# Patient Record
Sex: Female | Born: 1970 | Race: Black or African American | Hispanic: No | Marital: Single | Smoking: Never smoker
Health system: Southern US, Community
[De-identification: ages and names within clinical notes are randomized; demographics above are authoritative.]

## PROBLEM LIST (undated history)

## (undated) DIAGNOSIS — I10 Essential (primary) hypertension: Secondary | ICD-10-CM

## (undated) DIAGNOSIS — R748 Abnormal levels of other serum enzymes: Secondary | ICD-10-CM

## (undated) DIAGNOSIS — C801 Malignant (primary) neoplasm, unspecified: Secondary | ICD-10-CM

## (undated) DIAGNOSIS — D649 Anemia, unspecified: Secondary | ICD-10-CM

## (undated) DIAGNOSIS — K219 Gastro-esophageal reflux disease without esophagitis: Secondary | ICD-10-CM

## (undated) DIAGNOSIS — F319 Bipolar disorder, unspecified: Secondary | ICD-10-CM

## (undated) DIAGNOSIS — G4730 Sleep apnea, unspecified: Secondary | ICD-10-CM

## (undated) DIAGNOSIS — M1990 Unspecified osteoarthritis, unspecified site: Secondary | ICD-10-CM

## (undated) DIAGNOSIS — Z973 Presence of spectacles and contact lenses: Secondary | ICD-10-CM

## (undated) DIAGNOSIS — F419 Anxiety disorder, unspecified: Secondary | ICD-10-CM

## (undated) DIAGNOSIS — G459 Transient cerebral ischemic attack, unspecified: Secondary | ICD-10-CM

## (undated) DIAGNOSIS — R011 Cardiac murmur, unspecified: Secondary | ICD-10-CM

## (undated) HISTORY — PX: FOOT SURGERY: SHX648

## (undated) HISTORY — DX: Anemia, unspecified: D64.9

## (undated) HISTORY — DX: Cardiac murmur, unspecified: R01.1

## (undated) HISTORY — PX: WISDOM TOOTH EXTRACTION: SHX21

## (undated) HISTORY — DX: Essential (primary) hypertension: I10

## (undated) HISTORY — DX: Unspecified osteoarthritis, unspecified site: M19.90

## (undated) HISTORY — DX: Malignant (primary) neoplasm, unspecified: C80.1

---

## 2000-08-05 HISTORY — PX: LASIK: SHX215

## 2006-09-29 ENCOUNTER — Encounter: Admission: RE | Admit: 2006-09-29 | Discharge: 2006-09-29 | Payer: Self-pay | Admitting: Pediatrics

## 2010-08-05 DIAGNOSIS — C801 Malignant (primary) neoplasm, unspecified: Secondary | ICD-10-CM

## 2010-08-05 HISTORY — DX: Malignant (primary) neoplasm, unspecified: C80.1

## 2010-09-04 ENCOUNTER — Other Ambulatory Visit: Payer: Self-pay | Admitting: Internal Medicine

## 2010-09-04 DIAGNOSIS — Z1239 Encounter for other screening for malignant neoplasm of breast: Secondary | ICD-10-CM

## 2010-09-04 DIAGNOSIS — Z1231 Encounter for screening mammogram for malignant neoplasm of breast: Secondary | ICD-10-CM

## 2010-09-13 ENCOUNTER — Ambulatory Visit
Admission: RE | Admit: 2010-09-13 | Discharge: 2010-09-13 | Disposition: A | Discharge status: Home / Self Care | Payer: PRIVATE HEALTH INSURANCE | Source: Ambulatory Visit | Attending: Internal Medicine | Admitting: Internal Medicine

## 2010-09-13 DIAGNOSIS — Z1231 Encounter for screening mammogram for malignant neoplasm of breast: Secondary | ICD-10-CM

## 2010-09-19 ENCOUNTER — Other Ambulatory Visit: Payer: Self-pay | Admitting: Internal Medicine

## 2010-09-19 DIAGNOSIS — R928 Other abnormal and inconclusive findings on diagnostic imaging of breast: Secondary | ICD-10-CM

## 2010-09-24 ENCOUNTER — Other Ambulatory Visit: Payer: Self-pay | Admitting: Internal Medicine

## 2010-09-24 DIAGNOSIS — R928 Other abnormal and inconclusive findings on diagnostic imaging of breast: Secondary | ICD-10-CM

## 2010-09-25 ENCOUNTER — Other Ambulatory Visit: Payer: Self-pay | Admitting: Internal Medicine

## 2010-09-25 ENCOUNTER — Ambulatory Visit
Admission: RE | Admit: 2010-09-25 | Discharge: 2010-09-25 | Disposition: A | Discharge status: Home / Self Care | Payer: PRIVATE HEALTH INSURANCE | Source: Ambulatory Visit | Attending: Internal Medicine | Admitting: Internal Medicine

## 2010-09-25 ENCOUNTER — Other Ambulatory Visit: Payer: Self-pay | Admitting: Radiology

## 2010-09-25 DIAGNOSIS — R928 Other abnormal and inconclusive findings on diagnostic imaging of breast: Secondary | ICD-10-CM

## 2010-09-26 ENCOUNTER — Other Ambulatory Visit: Payer: Self-pay | Admitting: Family Medicine

## 2010-09-26 DIAGNOSIS — C50912 Malignant neoplasm of unspecified site of left female breast: Secondary | ICD-10-CM

## 2010-09-30 ENCOUNTER — Ambulatory Visit
Admission: RE | Admit: 2010-09-30 | Discharge: 2010-09-30 | Disposition: A | Discharge status: Home / Self Care | Payer: PRIVATE HEALTH INSURANCE | Source: Ambulatory Visit | Attending: Family Medicine | Admitting: Family Medicine

## 2010-09-30 DIAGNOSIS — C50912 Malignant neoplasm of unspecified site of left female breast: Secondary | ICD-10-CM

## 2010-09-30 MED ORDER — GADOBENATE DIMEGLUMINE 529 MG/ML IV SOLN
19.0000 mL | Freq: Once | INTRAVENOUS | Status: AC | PRN
  Administered 2010-09-30: 19 mL via INTRAVENOUS

## 2010-10-01 ENCOUNTER — Encounter: Payer: PRIVATE HEALTH INSURANCE | Admitting: Genetic Counselor

## 2010-10-01 ENCOUNTER — Encounter: Payer: PRIVATE HEALTH INSURANCE | Admitting: Oncology

## 2010-10-18 ENCOUNTER — Other Ambulatory Visit (HOSPITAL_COMMUNITY): Payer: Self-pay | Admitting: Surgery

## 2010-10-18 DIAGNOSIS — C50912 Malignant neoplasm of unspecified site of left female breast: Secondary | ICD-10-CM

## 2010-11-14 ENCOUNTER — Ambulatory Visit (HOSPITAL_COMMUNITY)
Admission: RE | Admit: 2010-11-14 | Discharge: 2010-11-14 | Disposition: A | Discharge status: Home / Self Care | Payer: PRIVATE HEALTH INSURANCE | Source: Ambulatory Visit | Attending: Surgery | Admitting: Surgery

## 2010-11-14 ENCOUNTER — Other Ambulatory Visit (HOSPITAL_COMMUNITY): Payer: Self-pay | Admitting: Surgery

## 2010-11-14 ENCOUNTER — Encounter (HOSPITAL_COMMUNITY)
Admission: RE | Admit: 2010-11-14 | Discharge: 2010-11-14 | Disposition: A | Discharge status: Home / Self Care | Payer: PRIVATE HEALTH INSURANCE | Source: Ambulatory Visit | Attending: Surgery | Admitting: Surgery

## 2010-11-14 DIAGNOSIS — Z01818 Encounter for other preprocedural examination: Secondary | ICD-10-CM | POA: Insufficient documentation

## 2010-11-14 DIAGNOSIS — C50919 Malignant neoplasm of unspecified site of unspecified female breast: Secondary | ICD-10-CM

## 2010-11-14 DIAGNOSIS — Z01810 Encounter for preprocedural cardiovascular examination: Secondary | ICD-10-CM | POA: Insufficient documentation

## 2010-11-14 DIAGNOSIS — Z01811 Encounter for preprocedural respiratory examination: Secondary | ICD-10-CM | POA: Insufficient documentation

## 2010-11-14 DIAGNOSIS — I10 Essential (primary) hypertension: Secondary | ICD-10-CM | POA: Insufficient documentation

## 2010-11-14 DIAGNOSIS — E119 Type 2 diabetes mellitus without complications: Secondary | ICD-10-CM | POA: Insufficient documentation

## 2010-11-14 LAB — CBC
Hemoglobin: 12.7 g/dL (ref 12.0–15.0)
MCH: 33.4 pg (ref 26.0–34.0)
MCHC: 33.5 g/dL (ref 30.0–36.0)
MCV: 99.7 fL (ref 78.0–100.0)
WBC: 9.4 10*3/uL (ref 4.0–10.5)

## 2010-11-14 LAB — DIFFERENTIAL
Basophils Relative: 0 % (ref 0–1)
Eosinophils Absolute: 0.1 10*3/uL (ref 0.0–0.7)
Eosinophils Relative: 1 % (ref 0–5)
Lymphs Abs: 5.2 10*3/uL — ABNORMAL HIGH (ref 0.7–4.0)
Monocytes Relative: 7 % (ref 3–12)
Neutro Abs: 3.4 10*3/uL (ref 1.7–7.7)
Neutrophils Relative %: 37 % — ABNORMAL LOW (ref 43–77)

## 2010-11-14 LAB — COMPREHENSIVE METABOLIC PANEL
ALT: 63 U/L — ABNORMAL HIGH (ref 0–35)
Albumin: 3.8 g/dL (ref 3.5–5.2)
Alkaline Phosphatase: 25 U/L — ABNORMAL LOW (ref 39–117)
BUN: 11 mg/dL (ref 6–23)
Chloride: 101 mEq/L (ref 96–112)
GFR calc Af Amer: >60 mL/min (ref >60)
Potassium: 3.8 mEq/L (ref 3.5–5.1)

## 2010-11-21 ENCOUNTER — Observation Stay (HOSPITAL_COMMUNITY)
Admission: RE | Admit: 2010-11-21 | Discharge: 2010-11-22 | Disposition: A | Discharge status: Home / Self Care | Payer: PRIVATE HEALTH INSURANCE | Source: Ambulatory Visit | Attending: Surgery | Admitting: Surgery

## 2010-11-21 ENCOUNTER — Ambulatory Visit (HOSPITAL_COMMUNITY)
Admission: RE | Admit: 2010-11-21 | Discharge: 2010-11-21 | Disposition: A | Discharge status: Home / Self Care | Payer: PRIVATE HEALTH INSURANCE | Source: Ambulatory Visit | Attending: Surgery | Admitting: Surgery

## 2010-11-21 ENCOUNTER — Other Ambulatory Visit: Payer: Self-pay | Admitting: Surgery

## 2010-11-21 DIAGNOSIS — C50912 Malignant neoplasm of unspecified site of left female breast: Secondary | ICD-10-CM

## 2010-11-21 DIAGNOSIS — D0590 Unspecified type of carcinoma in situ of unspecified breast: Principal | ICD-10-CM | POA: Insufficient documentation

## 2010-11-21 DIAGNOSIS — J45909 Unspecified asthma, uncomplicated: Secondary | ICD-10-CM | POA: Insufficient documentation

## 2010-11-21 DIAGNOSIS — F319 Bipolar disorder, unspecified: Secondary | ICD-10-CM | POA: Insufficient documentation

## 2010-11-21 DIAGNOSIS — E119 Type 2 diabetes mellitus without complications: Secondary | ICD-10-CM | POA: Insufficient documentation

## 2010-11-21 DIAGNOSIS — I10 Essential (primary) hypertension: Secondary | ICD-10-CM | POA: Insufficient documentation

## 2010-11-21 HISTORY — PX: MASTECTOMY: SHX3

## 2010-11-21 LAB — GLUCOSE, CAPILLARY: Glucose-Capillary: 91 mg/dL (ref 70–99)

## 2010-11-21 MED ORDER — TECHNETIUM TC 99M SULFUR COLLOID FILTERED
1.0000 | Freq: Once | INTRAVENOUS | Status: AC | PRN
  Administered 2010-11-21: 1 via INTRADERMAL

## 2010-11-22 LAB — GLUCOSE, CAPILLARY: Glucose-Capillary: 93 mg/dL (ref 70–99)

## 2010-11-23 NOTE — Discharge Summary (Signed)
  NAMETAURI, [name]               ACCOUNT NO.:  [id number]  MEDICAL RECORD NO.:  [id number]           PATIENT TYPE:  O  LOCATION:  5148                         FACILITY:  [facility]  PHYSICIAN:  Clovis Pu. Salah Nakamura, M.D.DATE OF BIRTH:  1970-08-14  DATE OF ADMISSION:  11/21/2010 DATE OF DISCHARGE:  11/22/2010                              DISCHARGE SUMMARY   ADMITTING DIAGNOSIS:  Left breast ductal carcinoma in situ.  DISCHARGE DIAGNOSIS:  Left breast ductal carcinoma in situ.  PROCEDURE PERFORMED:  Bilateral simple mastectomy with left axillary sentinel lymph node mapping.  BRIEF HISTORY:  The patient is a 40-year female with multifocal left breast DCIS.  She is admitted for bilateral mastectomy per her request and because she has a high-risk of recurrence in her lifetime.  She agreed to proceed.  HOSPITAL COURSE:  Unremarkable.  Please see operative note for details. She is discharged home postop day #1 in satisfactory condition.  She is tolerating her diet.  She was afebrile.  Wounds were clean, dry, and intact.  Drainage was adequate from all of her JP drains and not excessive.  DISCHARGE INSTRUCTIONS:  I will see her back in a week to begin to remove her drains.  She will keep her binder and Ace wrap on for 2 days. Remove the Ace wrap and binder, shower and replace a binder afterwards. She will resume her home medications which I have reviewed and checked off on in the medical reconciliation list.  I gave her a script for Percocet 1-2 tabs q.4 p.r.n. pain as well as Ultram 50 mg p.o. every 6 hours as needed.  She will resume a regular diet.  CONDITION ON DISCHARGE:  She is improved.     Rebecca Rice A. Benaiah Behan, M.D.     TAC/MEDQ  D:  11/22/2010  T:  11/22/2010  Job:  [id number]  Electronically Signed by Harriette Bouillon M.D. on 11/23/2010 01:14:03 PM

## 2010-11-23 NOTE — Op Note (Signed)
Rebecca, Rice               ACCOUNT NO.:  [id number]  MEDICAL RECORD NO.:  [id number]           PATIENT TYPE:  O  LOCATION:  5148                         FACILITY:  [facility]  PHYSICIAN:  Clovis Pu. Kavi Almquist, M.D.DATE OF BIRTH:  [birth year]  DATE OF PROCEDURE:  11/21/2010 DATE OF DISCHARGE:                              OPERATIVE REPORT   PREOPERATIVE DIAGNOSIS:  Left breast ductal carcinoma in situ, multifocal.  POSTOPERATIVE DIAGNOSIS:  Left breast ductal carcinoma in situ, multifocal.  PROCEDURES: 1. Bilateral simple mastectomy. 2. Left axillary sentinel lymph node mapping with injection of     methylene blue dye.  SURGEON:  Maisie Fus A. Marcile Fuquay, MD  ANESTHESIA:  General endotracheal anesthesia.  ESTIMATED BLOOD LOSS:  50 mL.  SPECIMENS: 1. Bilateral breast. 2. Left axillary sentinel nodes to pathology.  DRAINS:  Two JP drains to each side.  INDICATIONS FOR PROCEDURE:  The patient is a 40 year old female withmultifocal left breast DCIS.  She was seen by Plastic Surgery and given her young age and the fact that she has multifocal left breast DCIS and need for mastectomy on each side wished to proceed with bilateral mastectomies for prophylactic reasons on the right side.  Her lifetime risk of breast cancer given her young age and the fact she already has DCIS probably about 20-25% or so.  After discussion of the above and also after seeing Dr. Wayland Rice for plastic surgery options, she wished to proceed with bilateral mastectomies and has decided to delay reconstruction.  Risk of bleeding, infection, flap necrosis, complications of her diabetes, complications of the procedure, bleeding, infection, flap necrosis, need further surgery, arm swelling, stiffness, discomfort, numbness of the incision, reactions to medications we used for the procedure were all potential complications.  She understood the all above complications and agreed to proceed.  DESCRIPTION OF  PROCEDURE:  The patient was brought to the operating room.  She underwent left breast injection of technetium sulfur colloid in the holding area and I saw her in the holding area and marked her left axilla for some lymph node harvest site.  After having brought back to the operating room, she was placed supine and general anesthesia initiated.  Upper chest was prepped and draped bilaterally in sterile fashion.  Time-out was then done.  We then started our procedure in the right side which was a prophylactic side.  Curvilinear incisions were made above and below the nipple areolar complex and superior and inferior skin flaps were created up to the clavicle and down to the inferior mammary fold respectively.  The flap was taken all the way over to the midline.  The breast was then excised off the chest wall in a medial-to-lateral fashion until we got just lateral to the pectoralis minor muscle where it was amputated to include the tail of right breast. Wound was hemostatic.  It was irrigated.  I elected to go ahead and close this with combination of 3-0 Vicryl and subsequent 4-0 Monocryl stitch in a subcuticular fashion.  We then performed the same procedure on the left side.  Curvilinear incisions were made above and below the nipple-areolar complex  and superior and inferior skin flaps were made.  The flaps were taken medially to the midline.  The breast tissues were then excised in a medial-to-lateral fashion up to the pectoralis major fascia.  Once we got to the axilla, we identified 2 sentinel nodes, one was blue and hot. Of note, we injected 4 mL of methylene blue admixed with saline into the left breast in the subareolar position.  This was done prior to incision.  Once we identified this two nodes that were blue and hot, they were removed.  There were no other significant blue nodes or any significant radioactivity in the left axilla was noted.  At this point in time, we excised the  breast in a medial-to-lateral fashion after raising superior and inferior skin flaps.  The wound was irrigated. Hemostasis was achieved.  This was closed in a similar fashion with a deep layer of 3-0 Vicryl and subsequent 3-0 Monocryl subcuticular stitch.  Dermabond was placed on both sides.  Through separate stab incisions we placed 2 JP drains on the left and on the right side and secured to the skin with 3-0 nylon.  These were placed to bulb suction. All final counts of sponge, needle, and instruments were found to be correct for this portion of the case.  The patient was awoke, extubated, and taken to recovery in satisfactory condition.     Rebecca Rice A. Rebecca Rice, M.D.     TAC/MEDQ  D:  11/21/2010  T:  11/22/2010  Job:  [id number]  cc:   Rebecca Denis, DO Della Goo, M.D.  Electronically Signed by Harriette Bouillon M.D. on 11/23/2010 01:14:00 PM

## 2011-01-31 ENCOUNTER — Encounter (INDEPENDENT_AMBULATORY_CARE_PROVIDER_SITE_OTHER): Payer: Self-pay | Admitting: Surgery

## 2011-01-31 ENCOUNTER — Ambulatory Visit (INDEPENDENT_AMBULATORY_CARE_PROVIDER_SITE_OTHER): Payer: PRIVATE HEALTH INSURANCE | Admitting: Surgery

## 2011-01-31 DIAGNOSIS — D0590 Unspecified type of carcinoma in situ of unspecified breast: Secondary | ICD-10-CM

## 2011-01-31 DIAGNOSIS — D0510 Intraductal carcinoma in situ of unspecified breast: Secondary | ICD-10-CM

## 2011-01-31 NOTE — Progress Notes (Signed)
Subjective:     Patient ID: Rebecca Rice, female   DOB: 1970-12-19, 40 y.o.   MRN: [id number]    There were no vitals taken for this visit.    HPI: The patient returns to clinic today. Her chief complaint is tightness and stiffness along her mastectomy incisions. The pain as a sharp pain. It bothers her mostly in the morning. She also has some radiation of pain around both chest incisions.   Review of Systems negative     Objective:   Physical Exam :  Mastectomy incisions healed.  No erythema bilaterally.      Assessment:   S/P bilateral mastectomy for DCIS.  Plan:   Return in three months. Call Dr Kelly Splinter

## 2011-01-31 NOTE — Patient Instructions (Addendum)
Follow up in three months.   

## 2011-05-14 ENCOUNTER — Encounter (INDEPENDENT_AMBULATORY_CARE_PROVIDER_SITE_OTHER): Payer: Self-pay | Admitting: Surgery

## 2011-07-05 ENCOUNTER — Ambulatory Visit (INDEPENDENT_AMBULATORY_CARE_PROVIDER_SITE_OTHER): Payer: PRIVATE HEALTH INSURANCE | Admitting: Surgery

## 2011-07-05 ENCOUNTER — Encounter (INDEPENDENT_AMBULATORY_CARE_PROVIDER_SITE_OTHER): Payer: Self-pay | Admitting: Surgery

## 2011-07-05 ENCOUNTER — Encounter (INDEPENDENT_AMBULATORY_CARE_PROVIDER_SITE_OTHER): Payer: PRIVATE HEALTH INSURANCE | Admitting: Surgery

## 2011-07-05 VITALS — BP 116/76 | HR 70 | Temp 96.6°F | Resp 14 | Ht 60.0 in | Wt 206.2 lb

## 2011-07-05 DIAGNOSIS — Z853 Personal history of malignant neoplasm of breast: Secondary | ICD-10-CM

## 2011-07-05 NOTE — Patient Instructions (Signed)
Follow up 1 year 

## 2011-07-05 NOTE — Progress Notes (Signed)
Patient ID: Rebecca Rice, female   DOB: 19-Sep-1970, 40 y.o.   MRN: [id number]  Chief Complaint  Patient presents with  . Breast Cancer Long Term Follow Up    6 month follow up    HPI Rebecca Rice is a 40 y.o. female.  HPI The patient returns today for follow up of DCIS. She is 7 months out from bilateral mastectomy.   She complains of joint pain.   Past Medical History  Diagnosis Date  . Anemia   . Asthma   . Arthritis   . Diabetes mellitus   . COPD (chronic obstructive pulmonary disease)   . Heart murmur   . Hypertension   . Cancer     breast  . Nasal congestion   . Sore throat   . Change in voice   . Visual disturbance   . Cough   . Wheezing   . Constipation   . Nausea   . Generalized headaches   . Weakness     Past Surgical History  Procedure Date  . Foot surgery 1998  . Breast surgery     History reviewed. No pertinent family history.  Social History History  Substance Use Topics  . Smoking status: Never Smoker   . Smokeless tobacco: Never Used  . Alcohol Use: Yes    Allergies  Allergen Reactions  . Decongestant Formula   . Vicodin (Hydrocodone-Acetaminophen) Itching    Current Outpatient Prescriptions  Medication Sig Dispense Refill  . albuterol (PROVENTIL) (5 MG/ML) 0.5% nebulizer solution Take 2.5 mg by nebulization every 6 (six) hours as needed.        . benazepril-hydrochlorthiazide (LOTENSIN HCT) 20-25 MG per tablet Take 1 tablet by mouth 2 (two) times daily.        . cloNIDine-chlorthalidone (CLORPRES) 0.1-15 MG per tablet Take 10 tablets by mouth 2 (two) times daily.        . divalproex (DEPAKOTE) 500 MG 24 hr tablet Take 500 mg by mouth daily.        [name] glipiZIDE (GLUCOTROL) 5 MG tablet Take 5 mg by mouth 2 (two) times daily before a meal.        . metFORMIN (GLUCOPHAGE) 850 MG tablet Take 850 mg by mouth 2 (two) times daily with a meal.        . metoprolol (LOPRESSOR) 50 MG tablet Take 50 mg by mouth 2 (two) times daily.        .  metoprolol (TOPROL-XL) 50 MG 24 hr tablet Take 50 mg by mouth daily.        . mometasone (ASMANEX) 220 MCG/INH inhaler Inhale 220 puffs into the lungs daily.        . montelukast (SINGULAIR) 10 MG tablet Take 10 mg by mouth at bedtime.        . potassium chloride (K-DUR,KLOR-CON) 10 MEQ tablet Take 10 mEq by mouth 2 (two) times daily. M,W,F       . sertraline (ZOLOFT) 25 MG tablet Take 25 mg by mouth daily.          Review of Systems Review of Systems  Constitutional: Negative.   HENT: Negative.   Respiratory: Negative.   Cardiovascular: Negative.   Gastrointestinal: Negative.   Musculoskeletal: Positive for arthralgias.  Skin: Negative.     Blood pressure 116/76, pulse 70, temperature 96.6 F (35.9 C), temperature source Temporal, resp. rate 14, height 5' (1.524 m), weight 206 lb 3.2 oz (93.532 kg).  Physical Exam Physical Exam  Constitutional: She  appears well-developed and well-nourished.  HENT:  Head: Normocephalic and atraumatic.  Eyes: EOM are normal. Pupils are equal, round, and reactive to light.  Neck: Normal range of motion. Neck supple.  Pulmonary/Chest: Effort normal and breath sounds normal. She has no wheezes.       Both breast surgicly absent.  Some hypertrophic scarring.  No masses or axillary adenopathy.  Lymphadenopathy:    She has no cervical adenopathy.    Data Reviewed   Assessment    HistoryOFf DCIS S/P  B mastectomy April 2012.     Plan      Follow up 1 year.  Call Dr Kelly Splinter .       Keino Placencia A. 07/05/2011, 9:30 AM

## 2011-07-19 ENCOUNTER — Encounter (INDEPENDENT_AMBULATORY_CARE_PROVIDER_SITE_OTHER): Payer: Self-pay | Admitting: Surgery

## 2011-10-11 ENCOUNTER — Encounter (HOSPITAL_BASED_OUTPATIENT_CLINIC_OR_DEPARTMENT_OTHER): Payer: Self-pay | Admitting: *Deleted

## 2011-10-14 ENCOUNTER — Encounter (HOSPITAL_BASED_OUTPATIENT_CLINIC_OR_DEPARTMENT_OTHER)
Admission: RE | Admit: 2011-10-14 | Discharge: 2011-10-14 | Disposition: A | Discharge status: Home / Self Care | Payer: PRIVATE HEALTH INSURANCE | Source: Ambulatory Visit | Attending: Plastic Surgery | Admitting: Plastic Surgery

## 2011-10-14 ENCOUNTER — Encounter (HOSPITAL_BASED_OUTPATIENT_CLINIC_OR_DEPARTMENT_OTHER): Payer: Self-pay | Admitting: *Deleted

## 2011-10-14 LAB — BASIC METABOLIC PANEL
Chloride: 104 mEq/L (ref 96–112)
GFR calc Af Amer: >90 mL/min (ref >90)
GFR calc non Af Amer: >90 mL/min (ref >90)
Potassium: 3.9 mEq/L (ref 3.5–5.1)

## 2011-10-14 NOTE — Pre-Procedure Instructions (Signed)
To come for BMET 

## 2011-10-17 ENCOUNTER — Encounter (HOSPITAL_BASED_OUTPATIENT_CLINIC_OR_DEPARTMENT_OTHER): Payer: Self-pay | Admitting: Anesthesiology

## 2011-10-17 ENCOUNTER — Encounter (HOSPITAL_BASED_OUTPATIENT_CLINIC_OR_DEPARTMENT_OTHER): Payer: Self-pay | Admitting: Plastic Surgery

## 2011-10-17 ENCOUNTER — Ambulatory Visit (HOSPITAL_BASED_OUTPATIENT_CLINIC_OR_DEPARTMENT_OTHER): Payer: PRIVATE HEALTH INSURANCE | Admitting: Anesthesiology

## 2011-10-17 ENCOUNTER — Ambulatory Visit (HOSPITAL_BASED_OUTPATIENT_CLINIC_OR_DEPARTMENT_OTHER)
Admission: RE | Admit: 2011-10-17 | Discharge: 2011-10-18 | Disposition: A | Discharge status: Home / Self Care | Payer: PRIVATE HEALTH INSURANCE | Source: Ambulatory Visit | Attending: Plastic Surgery | Admitting: Plastic Surgery

## 2011-10-17 ENCOUNTER — Encounter (HOSPITAL_BASED_OUTPATIENT_CLINIC_OR_DEPARTMENT_OTHER): Payer: Self-pay | Admitting: *Deleted

## 2011-10-17 ENCOUNTER — Encounter (HOSPITAL_BASED_OUTPATIENT_CLINIC_OR_DEPARTMENT_OTHER)
Admission: RE | Disposition: A | Discharge status: Home / Self Care | Payer: Self-pay | Source: Ambulatory Visit | Attending: Plastic Surgery

## 2011-10-17 DIAGNOSIS — K219 Gastro-esophageal reflux disease without esophagitis: Secondary | ICD-10-CM | POA: Insufficient documentation

## 2011-10-17 DIAGNOSIS — Z01812 Encounter for preprocedural laboratory examination: Secondary | ICD-10-CM | POA: Insufficient documentation

## 2011-10-17 DIAGNOSIS — I10 Essential (primary) hypertension: Secondary | ICD-10-CM | POA: Insufficient documentation

## 2011-10-17 DIAGNOSIS — E119 Type 2 diabetes mellitus without complications: Secondary | ICD-10-CM | POA: Insufficient documentation

## 2011-10-17 DIAGNOSIS — Z853 Personal history of malignant neoplasm of breast: Secondary | ICD-10-CM | POA: Insufficient documentation

## 2011-10-17 DIAGNOSIS — Z9010 Acquired absence of unspecified breast and nipple: Secondary | ICD-10-CM

## 2011-10-17 DIAGNOSIS — Z421 Encounter for breast reconstruction following mastectomy: Secondary | ICD-10-CM | POA: Insufficient documentation

## 2011-10-17 HISTORY — PX: BREAST RECONSTRUCTION: SHX9

## 2011-10-17 HISTORY — DX: Transient cerebral ischemic attack, unspecified: G45.9

## 2011-10-17 HISTORY — DX: Bipolar disorder, unspecified: F31.9

## 2011-10-17 HISTORY — DX: Gastro-esophageal reflux disease without esophagitis: K21.9

## 2011-10-17 LAB — GLUCOSE, CAPILLARY: Glucose-Capillary: 125 mg/dL — ABNORMAL HIGH (ref 70–99)

## 2011-10-17 SURGERY — RECONSTRUCTION, BREAST
Anesthesia: General | Site: Breast | Laterality: Bilateral | Wound class: Clean

## 2011-10-17 MED ORDER — CEFAZOLIN SODIUM 1-5 GM-% IV SOLN
1.0000 g | INTRAVENOUS | Status: AC
  Administered 2011-10-17: 2 g via INTRAVENOUS

## 2011-10-17 MED ORDER — FENTANYL CITRATE 0.05 MG/ML IJ SOLN
INTRAMUSCULAR | Status: DC | PRN
  Administered 2011-10-17: 100 ug via INTRAVENOUS

## 2011-10-17 MED ORDER — ONDANSETRON HCL 4 MG/2ML IJ SOLN
INTRAMUSCULAR | Status: DC | PRN
  Administered 2011-10-17 (×2): 4 mg via INTRAVENOUS

## 2011-10-17 MED ORDER — TRAMADOL HCL 50 MG PO TABS
50.0000 mg | ORAL_TABLET | Freq: Four times a day (QID) | ORAL | Status: DC | PRN
  Administered 2011-10-17 – 2011-10-18 (×2): 50 mg via ORAL

## 2011-10-17 MED ORDER — ONDANSETRON HCL 4 MG/2ML IJ SOLN: 4.0000 mg | Freq: Four times a day (QID) | INTRAMUSCULAR | Status: DC | PRN

## 2011-10-17 MED ORDER — DIAZEPAM 2 MG PO TABS
2.0000 mg | ORAL_TABLET | Freq: Two times a day (BID) | ORAL | Status: DC | PRN
  Administered 2011-10-17: 2 mg via ORAL

## 2011-10-17 MED ORDER — HYDROMORPHONE HCL PF 1 MG/ML IJ SOLN
0.2500 mg | INTRAMUSCULAR | Status: DC | PRN
  Administered 2011-10-17 (×3): 0.5 mg via INTRAVENOUS

## 2011-10-17 MED ORDER — FLUTICASONE PROPIONATE HFA 44 MCG/ACT IN AERO
2.0000 | INHALATION_SPRAY | Freq: Two times a day (BID) | RESPIRATORY_TRACT | Status: DC
  Administered 2011-10-17: 2 via RESPIRATORY_TRACT

## 2011-10-17 MED ORDER — OXYCODONE HCL 5 MG PO TABS
5.0000 mg | ORAL_TABLET | ORAL | Status: DC | PRN
  Administered 2011-10-17: 5 mg via ORAL

## 2011-10-17 MED ORDER — ALBUTEROL SULFATE HFA 108 (90 BASE) MCG/ACT IN AERS: 2.0000 | INHALATION_SPRAY | Freq: Four times a day (QID) | RESPIRATORY_TRACT | Status: DC | PRN

## 2011-10-17 MED ORDER — MORPHINE SULFATE 10 MG/ML IJ SOLN
INTRAMUSCULAR | Status: DC | PRN
  Administered 2011-10-17 (×3): 2 mg via INTRAVENOUS

## 2011-10-17 MED ORDER — SERTRALINE HCL 25 MG PO TABS: 25.0000 mg | ORAL_TABLET | Freq: Every day | ORAL | Status: DC

## 2011-10-17 MED ORDER — LIDOCAINE HCL (CARDIAC) 20 MG/ML IV SOLN
INTRAVENOUS | Status: DC | PRN
  Administered 2011-10-17: 40 mg via INTRAVENOUS

## 2011-10-17 MED ORDER — PROPOFOL 10 MG/ML IV EMUL
INTRAVENOUS | Status: DC | PRN
  Administered 2011-10-17 (×2): 20 mg via INTRAVENOUS
  Administered 2011-10-17: 300 mg via INTRAVENOUS

## 2011-10-17 MED ORDER — CLONIDINE-CHLORTHALIDONE 0.1-15 MG PO TABS
10.0000 | ORAL_TABLET | Freq: Two times a day (BID) | ORAL | Status: DC
  Administered 2011-10-17: 10 via ORAL

## 2011-10-17 MED ORDER — ACETAMINOPHEN 650 MG RE SUPP: 650.0000 mg | Freq: Four times a day (QID) | RECTAL | Status: DC | PRN

## 2011-10-17 MED ORDER — SODIUM CHLORIDE 0.9 % IR SOLN
Status: DC | PRN
  Administered 2011-10-17: 09:00:00

## 2011-10-17 MED ORDER — PHENYLEPHRINE HCL 10 MG/ML IJ SOLN
10.0000 mg | INTRAVENOUS | Status: DC | PRN
  Administered 2011-10-17: 50 ug/min via INTRAVENOUS

## 2011-10-17 MED ORDER — LOSARTAN POTASSIUM-HCTZ 100-25 MG PO TABS
1.0000 | ORAL_TABLET | Freq: Every day | ORAL | Status: DC
  Administered 2011-10-17: 1 via ORAL

## 2011-10-17 MED ORDER — MONTELUKAST SODIUM 10 MG PO TABS: 10.0000 mg | ORAL_TABLET | Freq: Every day | ORAL | Status: DC

## 2011-10-17 MED ORDER — MORPHINE SULFATE 2 MG/ML IJ SOLN
1.0000 mg | INTRAMUSCULAR | Status: DC | PRN
  Administered 2011-10-18 (×2): 1 mg via INTRAVENOUS

## 2011-10-17 MED ORDER — DIVALPROEX SODIUM ER 500 MG PO TB24
2000.0000 mg | ORAL_TABLET | Freq: Every day | ORAL | Status: DC
  Administered 2011-10-17: 2000 mg via ORAL

## 2011-10-17 MED ORDER — DIPHENHYDRAMINE HCL 25 MG PO CAPS
25.0000 mg | ORAL_CAPSULE | Freq: Four times a day (QID) | ORAL | Status: DC | PRN
  Administered 2011-10-17 – 2011-10-18 (×2): 25 mg via ORAL

## 2011-10-17 MED ORDER — ONE-DAILY MULTI VITAMINS PO TABS: 1.0000 | ORAL_TABLET | Freq: Every day | ORAL | Status: DC

## 2011-10-17 MED ORDER — ACETAMINOPHEN 10 MG/ML IV SOLN
1000.0000 mg | Freq: Once | INTRAVENOUS | Status: AC
  Administered 2011-10-17: 1000 mg via INTRAVENOUS

## 2011-10-17 MED ORDER — MIDAZOLAM HCL 2 MG/2ML IJ SOLN: 0.5000 mg | INTRAMUSCULAR | Status: DC | PRN

## 2011-10-17 MED ORDER — MIDAZOLAM HCL 5 MG/5ML IJ SOLN
INTRAMUSCULAR | Status: DC | PRN
  Administered 2011-10-17: 2 mg via INTRAVENOUS

## 2011-10-17 MED ORDER — METFORMIN HCL 500 MG PO TABS
500.0000 mg | ORAL_TABLET | Freq: Two times a day (BID) | ORAL | Status: DC
  Administered 2011-10-17: 500 mg via ORAL

## 2011-10-17 MED ORDER — KCL IN DEXTROSE-NACL 10-5-0.45 MEQ/L-%-% IV SOLN: INTRAVENOUS | Status: DC

## 2011-10-17 MED ORDER — METOCLOPRAMIDE HCL 5 MG/ML IJ SOLN
INTRAMUSCULAR | Status: DC | PRN
  Administered 2011-10-17 (×2): 10 mg via INTRAVENOUS

## 2011-10-17 MED ORDER — LACTATED RINGERS IV SOLN
INTRAVENOUS | Status: DC
  Administered 2011-10-17: 07:00:00 via INTRAVENOUS

## 2011-10-17 MED ORDER — EPHEDRINE SULFATE 50 MG/ML IJ SOLN
INTRAMUSCULAR | Status: DC | PRN
  Administered 2011-10-17: 15 mg via INTRAVENOUS

## 2011-10-17 MED ORDER — POTASSIUM CHLORIDE CRYS ER 10 MEQ PO TBCR: 10.0000 meq | EXTENDED_RELEASE_TABLET | ORAL | Status: DC

## 2011-10-17 MED ORDER — ACETAMINOPHEN 325 MG PO TABS: 650.0000 mg | ORAL_TABLET | Freq: Four times a day (QID) | ORAL | Status: DC | PRN

## 2011-10-17 MED ORDER — CEFAZOLIN SODIUM 1-5 GM-% IV SOLN
1.0000 g | Freq: Three times a day (TID) | INTRAVENOUS | Status: DC
  Administered 2011-10-17 (×2): 1 g via INTRAVENOUS

## 2011-10-17 MED ORDER — GLIPIZIDE 10 MG PO TABS
10.0000 mg | ORAL_TABLET | Freq: Two times a day (BID) | ORAL | Status: DC
  Administered 2011-10-17: 10 mg via ORAL

## 2011-10-17 MED ORDER — METOCLOPRAMIDE HCL 5 MG/ML IJ SOLN: 10.0000 mg | Freq: Once | INTRAMUSCULAR | Status: AC | PRN

## 2011-10-17 SURGICAL SUPPLY — 68 items
BAG DECANTER FOR FLEXI CONT (MISCELLANEOUS) ×4 IMPLANT
BANDAGE GAUZE ELAST BULKY 4 IN (GAUZE/BANDAGES/DRESSINGS) IMPLANT
BINDER BREAST LRG (GAUZE/BANDAGES/DRESSINGS) IMPLANT
BINDER BREAST MEDIUM (GAUZE/BANDAGES/DRESSINGS) IMPLANT
BINDER BREAST XLRG (GAUZE/BANDAGES/DRESSINGS) ×2 IMPLANT
BINDER BREAST XXLRG (GAUZE/BANDAGES/DRESSINGS) IMPLANT
BIOPATCH RED 1 DISK 7.0 (GAUZE/BANDAGES/DRESSINGS) ×4 IMPLANT
BLADE HEX COATED 2.75 (ELECTRODE) ×2 IMPLANT
BLADE SURG 15 STRL LF DISP TIS (BLADE) ×3 IMPLANT
BLADE SURG 15 STRL SS (BLADE) ×3
CANISTER SUCTION 1200CC (MISCELLANEOUS) IMPLANT
CANISTER SUCTION 2500CC (MISCELLANEOUS) ×2 IMPLANT
CHLORAPREP W/TINT 26ML (MISCELLANEOUS) ×2 IMPLANT
CLOTH BEACON ORANGE TIMEOUT ST (SAFETY) ×2 IMPLANT
COVER MAYO STAND STRL (DRAPES) ×2 IMPLANT
COVER TABLE BACK 60X90 (DRAPES) ×2 IMPLANT
DECANTER SPIKE VIAL GLASS SM (MISCELLANEOUS) IMPLANT
DERMABOND ADVANCED (GAUZE/BANDAGES/DRESSINGS) ×2
DERMABOND ADVANCED .7 DNX12 (GAUZE/BANDAGES/DRESSINGS) ×2 IMPLANT
DRAIN CHANNEL 19F RND (DRAIN) ×4 IMPLANT
DRAPE LAPAROSCOPIC ABDOMINAL (DRAPES) ×2 IMPLANT
DRSG PAD ABDOMINAL 8X10 ST (GAUZE/BANDAGES/DRESSINGS) ×4 IMPLANT
DRSG TEGADERM 2-3/8X2-3/4 SM (GAUZE/BANDAGES/DRESSINGS) ×4 IMPLANT
ELECT BLADE 4.0 EZ CLEAN MEGAD (MISCELLANEOUS) ×2
ELECT BLADE 6.5 .24CM SHAFT (ELECTRODE) IMPLANT
ELECT REM PT RETURN 9FT ADLT (ELECTROSURGICAL) ×2
ELECTRODE BLDE 4.0 EZ CLN MEGD (MISCELLANEOUS) ×1 IMPLANT
ELECTRODE REM PT RTRN 9FT ADLT (ELECTROSURGICAL) ×1 IMPLANT
EVACUATOR SILICONE 100CC (DRAIN) ×4 IMPLANT
GAUZE SPONGE 4X4 12PLY STRL LF (GAUZE/BANDAGES/DRESSINGS) ×4 IMPLANT
GLOVE BIO SURGEON STRL SZ 6.5 (GLOVE) ×8 IMPLANT
GLOVE BIOGEL PI IND STRL 7.0 (GLOVE) ×1 IMPLANT
GLOVE BIOGEL PI INDICATOR 7.0 (GLOVE) ×1
GLOVE SKINSENSE NS SZ7.0 (GLOVE) ×2
GLOVE SKINSENSE STRL SZ7.0 (GLOVE) ×2 IMPLANT
GOWN PREVENTION PLUS XLARGE (GOWN DISPOSABLE) ×6 IMPLANT
GRAFT FLEX HD BILAT 4X16 THICK (Tissue Mesh) ×4 IMPLANT
IV NS 1000ML (IV SOLUTION) ×1
IV NS 1000ML BAXH (IV SOLUTION) ×1 IMPLANT
IV NS 500ML (IV SOLUTION)
IV NS 500ML BAXH (IV SOLUTION) IMPLANT
KIT FILL SYSTEM UNIVERSAL (SET/KITS/TRAYS/PACK) ×2 IMPLANT
Mentor CPX3 medium height siltex contour profile b (Breast) ×2 IMPLANT
Mentor CPX3 medium height siltex contour profile t (Breast) ×2 IMPLANT
NEEDLE 21 GA WING INFUSION (NEEDLE) IMPLANT
NEEDLE HYPO 25X1 1.5 SAFETY (NEEDLE) IMPLANT
NEEDLE SPNL 22GX3.5 QUINCKE BK (NEEDLE) IMPLANT
NS IRRIG 1000ML POUR BTL (IV SOLUTION) ×2 IMPLANT
PACK BASIN DAY SURGERY FS (CUSTOM PROCEDURE TRAY) ×2 IMPLANT
PENCIL BUTTON HOLSTER BLD 10FT (ELECTRODE) ×2 IMPLANT
PIN SAFETY STERILE (MISCELLANEOUS) IMPLANT
SLEEVE SCD COMPRESS KNEE MED (MISCELLANEOUS) ×2 IMPLANT
SPONGE LAP 18X18 X RAY DECT (DISPOSABLE) ×4 IMPLANT
STRIP CLOSURE SKIN 1/2X4 (GAUZE/BANDAGES/DRESSINGS) IMPLANT
SUT MON AB 5-0 PS2 18 (SUTURE) ×4 IMPLANT
SUT PDS AB 2-0 CT2 27 (SUTURE) ×8 IMPLANT
SUT SILK 2 0 FS (SUTURE) ×4 IMPLANT
SUT VIC AB 3-0 SH 27 (SUTURE) ×1
SUT VIC AB 3-0 SH 27X BRD (SUTURE) ×1 IMPLANT
SUT VICRYL 4-0 PS2 18IN ABS (SUTURE) ×8 IMPLANT
SYR 50ML LL SCALE MARK (SYRINGE) IMPLANT
SYR BULB IRRIGATION 50ML (SYRINGE) ×2 IMPLANT
SYR CONTROL 10ML LL (SYRINGE) IMPLANT
TOWEL OR 17X24 6PK STRL BLUE (TOWEL DISPOSABLE) ×4 IMPLANT
TUBE CONNECTING 20X1/4 (TUBING) ×4 IMPLANT
UNDERPAD 30X30 INCONTINENT (UNDERPADS AND DIAPERS) ×4 IMPLANT
WATER STERILE IRR 1000ML POUR (IV SOLUTION) IMPLANT
YANKAUER SUCT BULB TIP NO VENT (SUCTIONS) ×2 IMPLANT

## 2011-10-17 NOTE — Anesthesia Postprocedure Evaluation (Signed)
Anesthesia Post Note  Patient: Rebecca Rice  Procedure(s) Performed: Procedure(s) (LRB): BREAST RECONSTRUCTION (Bilateral)  Anesthesia type: General  Patient location: PACU  Post pain: Pain level controlled  Post assessment: Patient's Cardiovascular Status Stable  Last Vitals:  Filed Vitals:   10/17/11 1130  BP: 128/80  Pulse: 82  Temp:   Resp: 22    Post vital signs: Reviewed and stable  Level of consciousness: alert  Complications: No apparent anesthesia complications

## 2011-10-17 NOTE — Anesthesia Procedure Notes (Signed)
Procedure Name: LMA Insertion Performed by: York Grice Pre-anesthesia Checklist: Patient identified, Emergency Drugs available, Suction available, Patient being monitored and Timeout performed Patient Re-evaluated:Patient Re-evaluated prior to inductionOxygen Delivery Method: Circle system utilized Preoxygenation: Pre-oxygenation with 100% oxygen Intubation Type: IV induction Ventilation: Mask ventilation without difficulty LMA: LMA with gastric port inserted LMA Size: 4.0 Number of attempts: 1 Tube secured with: Tape Dental Injury: Teeth and Oropharynx as per pre-operative assessment

## 2011-10-17 NOTE — H&P (Signed)
History and Physical  Breanah Faddis   10/01/2011 9:15 AM Office Visit  MRN: [id number]  Department:  Plastic Surgery  Dept Phone: [phone]  Description: Female DOB: 16-Jul-1971  Provider: Wayland Denis, DO    Diagnoses  -  CA - breast cancer   - Primary   174.9     Reason for Visit  -   Breast Reconstruction     Subjective:   Patient ID: Rebecca Rice is a 41 y.o. female.  HPI Ms. Monger is a 41 yrs old female diagnoses with left breast multifocal DCIS (10/02/10). She is here for her history and physical for bilateral breast reconstruction planned for 10/17/11. She underwent bilateral mastectomies 4/12 with left SLN bx. Genetic screening was negative for BRCA 1and 2. She is bipolar and has asthma, diabetes, hypertension and BMI >35. She has not had any abdominal surgery and does not use tobacco. Her bra size was a 40D before the surgery. We discussed the risks and options for breast reconstruction. She has not had radiation and none is planned. She is a good candidate for expander placement with FlexHD reconstruction. She is a moderate risk patient with her comorbidities which was discussed. She is planning on undergoing expander/implant reconstruction. We should be able to reach a cup size B to C which she is happy about.  She is tobacco free.  The following portions of the patient's history were reviewed and updated as appropriate: allergies, current medications, past family history, past medical history, past social history, past surgical history and problem list.  Review of Systems  Constitutional: Negative.   HENT: Negative.   Eyes: Negative.   Respiratory: Negative.   Cardiovascular: Negative.   Gastrointestinal: Negative.   Genitourinary: Negative.   Musculoskeletal: Negative.   Skin: Negative.   Neurological: Negative.   Hematological: Negative.   Psychiatric/Behavioral: Negative.    Objective:   Physical Exam  Constitutional: She is oriented to person, place,  and time. She appears well-developed and well-nourished.  HENT:   Head: Normocephalic and atraumatic.  Eyes: Conjunctivae and EOM are normal. Pupils are equal, round, and reactive to light.  Neck: Normal range of motion.  Cardiovascular: Normal rate.   Pulmonary/Chest: Effort normal. No respiratory distress. She has no wheezes.  Abdominal: Soft. She exhibits no distension. There is no tenderness.  Musculoskeletal: Normal range of motion.  Neurological: She is alert and oriented to person, place, and time.  Skin: Skin is warm.  Psychiatric: She has a normal mood and affect. Her behavior is normal. Judgment and thought content normal.     Assessment:  1.  CA - breast cancer     Plan:    Assessment and Plan:   Acquired absence of the breasts secondary to bilateral mastectomies. A general discussion regarding all available methods of breast reconstruction were discussed and included: Tissue expander and implant based reconstruction, autologous only reconstructions, combination procedures, particularly latissismus dorsi flaps combined with expanders.  For each of the reconstruction methods mentioned above, the risks, benefits, alternatives, scarring, and recovery time were discussed in great detail. Specific risks detailed included bleeding, infection, hematoma, seroma, scarring, pain, wound healing complications, flap loss, fat necrosis, capsular contracture, need for implant removal, donor site complications, bulge, hernia, umbilical necrosis, need for urgent reoperation, and need for dressing changes were discussed.   The patient would like to proceed with Expander/FlexHD reconstruction. The patient was seen in holding, marked and consent signed and confirmed. There is no change in her history or physical.  Medications Ordered at the office     cephalexin (KEFLEX) 500 MG capsule 28 Take 1 capsule (500 mg total) by mouth 4 times daily.     docusate sodium (COLACE) 100 MG capsule 20 Take 1  capsule (100 mg total) by mouth 3 times daily.    HYDROcodone-acetaminophen (NORCO) 5-325 mg per tablet 30 tablet 0 10/01/2011 10/11/2011    Take 1 tablet by mouth every 6 (six) hours as needed for 10 days for Pain.    promethazine (PHENERGAN) 12.5 MG tablet 20  Take 1 tablet (12.5 mg total) by mouth every 6 (six) hours as needed for 7 days for Nausea.    diazepam (VALIUM) 2 MG tablet 20  Take 1 tablet (2 mg total) by mouth every 12 (twelve) hours as needed for 10 days for Anxiety.

## 2011-10-17 NOTE — Brief Op Note (Signed)
10/17/2011  10:03 AM  PATIENT:  Rebecca Rice  41 y.o. 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  PROCEDURE:  Procedure(s) (LRB): BREAST RECONSTRUCTION (Bilateral) WITH EXPANDER AND FLEX HD PLACEMENT  SURGEON:  Surgeon(s) and Role:    * Mehran Guderian Sanger, DO - Primary  PHYSICIAN ASSISTANT: none  ASSISTANTS: none   ANESTHESIA:   general  EBL:  Total I/O In: 2000 [I.V.:2000] Out: -   BLOOD ADMINISTERED:none  DRAINS: (2) Jackson-Pratt drain(s) with closed bulb suction in the breast pocket on each side   LOCAL MEDICATIONS USED:  NONE  SPECIMEN:  Mastectomy scars on each side  DISPOSITION OF SPECIMEN:  PATHOLOGY  COUNTS:  YES  TOURNIQUET:  * No tourniquets in log *  DICTATION: dictated  PLAN OF CARE: Admit for overnight observation  PATIENT DISPOSITION:  PACU - hemodynamically stable.   Delay start of Pharmacological VTE agent (>24hrs) due to surgical blood loss or risk of bleeding: no

## 2011-10-17 NOTE — Transfer of Care (Signed)
Immediate Anesthesia Transfer of Care Note  Patient: Rebecca Rice  Procedure(s) Performed: Procedure(s) (LRB): BREAST RECONSTRUCTION (Bilateral)  Patient Location: PACU  Anesthesia Type: General  Level of Consciousness: awake  Airway & Oxygen Therapy: Patient Spontanous Breathing and Patient connected to face mask oxygen  Post-op Assessment: Report given to PACU RN and Post -op Vital signs reviewed and stable  Post vital signs: Reviewed and stable  Complications: No apparent anesthesia complications

## 2011-10-17 NOTE — Discharge Instructions (Addendum)
Drain care daily No heavy lifting Walk at home  See JP drain care sheet to record amounts collected from each side and record separately in CC's. Take this form with you when you go back to Dr. Leonie Green office.

## 2011-10-17 NOTE — Anesthesia Preprocedure Evaluation (Signed)
Anesthesia Evaluation  Patient identified by MRN, date of birth, ID band Patient awake    Reviewed: Allergy & Precautions, H&P , NPO status , Patient's Chart, lab work & pertinent test results, reviewed documented beta blocker date and time   Airway Mallampati: II TM Distance: >3 FB Neck ROM: full    Dental   Pulmonary asthma ,          Cardiovascular hypertension, On Medications + Valvular Problems/Murmurs     Neuro/Psych PSYCHIATRIC DISORDERS TIA   GI/Hepatic negative GI ROS, Neg liver ROS, GERD-  Medicated and Controlled,  Endo/Other  Diabetes mellitus-, Type 2, Oral Hypoglycemic AgentsMorbid obesity  Renal/GU negative Renal ROS  negative genitourinary   Musculoskeletal   Abdominal   Peds  Hematology negative hematology ROS (+)   Anesthesia Other Findings See surgeon's H&P   Reproductive/Obstetrics negative OB ROS                           Anesthesia Physical Anesthesia Plan  ASA: III  Anesthesia Plan: General   Post-op Pain Management:    Induction: Intravenous  Airway Management Planned: Oral ETT  Additional Equipment:   Intra-op Plan:   Post-operative Plan: Extubation in OR  Informed Consent: I have reviewed the patients History and Physical, chart, labs and discussed the procedure including the risks, benefits and alternatives for the proposed anesthesia with the patient or authorized representative who has indicated his/her understanding and acceptance.     Plan Discussed with: CRNA and Surgeon  Anesthesia Plan Comments:         Anesthesia Quick Evaluation

## 2011-10-18 MED ORDER — TRAMADOL HCL 50 MG PO TABS: 50.0000 mg | ORAL_TABLET | Freq: Four times a day (QID) | ORAL | Status: AC | PRN

## 2011-10-18 MED ORDER — DIPHENHYDRAMINE HCL 25 MG PO CAPS: 25.0000 mg | ORAL_CAPSULE | Freq: Four times a day (QID) | ORAL | Status: AC | PRN

## 2011-10-18 MED ORDER — FEXOFENADINE HCL 60 MG PO TABS: 60.0000 mg | ORAL_TABLET | Freq: Every day | ORAL | Status: DC

## 2011-10-18 MED ORDER — RANITIDINE HCL 150 MG PO TABS: 150.0000 mg | ORAL_TABLET | Freq: Two times a day (BID) | ORAL | Status: AC

## 2011-10-18 NOTE — Op Note (Signed)
NAME:  Rebecca Rice, Rebecca Rice               ACCOUNT NO.:  [id number]  MEDICAL RECORD NO.:  [id number]  LOCATION:                                 FACILITY:  PHYSICIAN:  Jaena Brocato Sanger, DO           DATE OF BIRTH:  DATE OF PROCEDURE:  10/17/2011 DATE OF DISCHARGE:                              OPERATIVE REPORT   POSTOP DIAGNOSIS:  Acquired absence of breast and nipple areola secondary to breast cancer, bilateral.  POSTOPERATIVE DIAGNOSIS:  Acquired absence of breast and nipple areola secondary to breast cancer, bilateral.  PROCEDURE:  Bilateral breast reconstruction with expander and FlexHD placement.  ATTENDING:  Wayland Denis, DO  ANESTHESIA:  General.  INDICATION FOR PROCEDURE:  The patient is a 41 year old female who was treated for breast cancer by undergoing bilateral mastectomies in the past.  She did not get radiated. She underwent evaluation and full options for breast reconstruction were discussed. She decided on expander and FlexHD reconstruction. The risks included infection, bleeding, pain, scar, capsular contracture and risks of anesthesia. Consent was signed.  The patient was marked and taken to the operating room.  DESCRIPTION OF PROCEDURE:  Once in the operating room, the patient was placed on the operating room table in a supine position.  General anesthesia was administered.  Once adequate, a time-out was called.  All information was confirmed to be correct.  She was prepped and draped in usual sterile fashion.  A marking pen was used to mark the mastectomy scar site and a 15 blade was used to excise the scar and it was sent to pathology.  The skin hooks and Bovie were used to lift the skin flaps from the pectoralis major muscle to the edge of the borders of the breast.  Once that was complete, the pocket was irrigated with antibiotic solution.  The Bovie was used to lift the pectoralis major muscle.  Once it was lifted, the 3350 mL mentor medium height  expander was placed under the pectoralis major muscle.  The expander was prepared according to the manufacture guidelines.  The air was evacuated and it was inflated with a 150 mL of injectable normal saline.  Prior to placing the expander,  the FlexHD was placed. It was prepared according to the manufacture's guidelines.  A 4 x 16 piece of FlexHD was utilized. Fenestrations were used to help prevent seroma underneath the expander. The FlexHD was tacked to the pectoralis major muscle edge with 2-0 PDS, and to the inframammary fold  to the chest wall at the level of the inframammary fold utilizing the 2-0 PDS.  Once the expander was placed, the FlexHD was tacked to the lateral chest wall with the PDS as well.  A 15-blade was used to make a stab incision and a JP drain was placed and secured to the skin with a 3-0 silk.  The deep layers were closed with 3- 0 Vicryl followed by 4-0 Vicryl and finally a 5-0 Monocryl.  The same exact procedure was done on both sides.  A total of 150 mL of normal saline was placed in each expander.  The Dermabond was applied with ABDs and breast binder.  The patient tolerated the procedure well.  There were no complications.  She was awoken and taken to recovery room in stable condition.     Wayland Denis, DO     CS/MEDQ  D:  10/17/2011  T:  10/18/2011  Job:  [id number]

## 2011-10-23 ENCOUNTER — Encounter (HOSPITAL_BASED_OUTPATIENT_CLINIC_OR_DEPARTMENT_OTHER): Payer: Self-pay

## 2011-10-24 ENCOUNTER — Encounter (HOSPITAL_BASED_OUTPATIENT_CLINIC_OR_DEPARTMENT_OTHER): Payer: Self-pay | Admitting: Plastic Surgery

## 2011-12-12 ENCOUNTER — Emergency Department: Payer: Self-pay | Admitting: Emergency Medicine

## 2012-06-03 ENCOUNTER — Encounter (INDEPENDENT_AMBULATORY_CARE_PROVIDER_SITE_OTHER): Payer: Self-pay | Admitting: Surgery

## 2012-06-18 NOTE — Progress Notes (Signed)
To come in for bmet-ekg-to bring all meds and overnight bag Snores, never tested for sleep apnea-denies any resp problems post op bilat mastectomies and tissue expanders

## 2012-06-19 ENCOUNTER — Other Ambulatory Visit: Payer: Self-pay

## 2012-06-19 ENCOUNTER — Encounter (HOSPITAL_BASED_OUTPATIENT_CLINIC_OR_DEPARTMENT_OTHER)
Admission: RE | Admit: 2012-06-19 | Discharge: 2012-06-19 | Disposition: A | Discharge status: Home / Self Care | Payer: BC Managed Care – PPO | Source: Ambulatory Visit | Attending: Plastic Surgery | Admitting: Plastic Surgery

## 2012-06-19 LAB — BASIC METABOLIC PANEL
BUN: 12 mg/dL (ref 6–23)
CO2: 24 mEq/L (ref 19–32)
Chloride: 107 mEq/L (ref 96–112)
Creatinine, Ser: 0.75 mg/dL (ref 0.50–1.10)
Glucose, Bld: 87 mg/dL (ref 70–99)
Potassium: 3.9 mEq/L (ref 3.5–5.1)

## 2012-06-19 NOTE — Progress Notes (Signed)
ekg reviewed and cleared by dr kasik  

## 2012-06-24 ENCOUNTER — Ambulatory Visit (HOSPITAL_BASED_OUTPATIENT_CLINIC_OR_DEPARTMENT_OTHER): Payer: BC Managed Care – PPO | Admitting: Anesthesiology

## 2012-06-24 ENCOUNTER — Encounter (HOSPITAL_BASED_OUTPATIENT_CLINIC_OR_DEPARTMENT_OTHER): Payer: Self-pay | Admitting: Anesthesiology

## 2012-06-24 ENCOUNTER — Ambulatory Visit (HOSPITAL_BASED_OUTPATIENT_CLINIC_OR_DEPARTMENT_OTHER)
Admission: RE | Admit: 2012-06-24 | Discharge: 2012-06-24 | Disposition: A | Discharge status: Home / Self Care | Payer: BC Managed Care – PPO | Source: Ambulatory Visit | Attending: Plastic Surgery | Admitting: Plastic Surgery

## 2012-06-24 ENCOUNTER — Encounter (HOSPITAL_BASED_OUTPATIENT_CLINIC_OR_DEPARTMENT_OTHER)
Admission: RE | Disposition: A | Discharge status: Home / Self Care | Payer: Self-pay | Source: Ambulatory Visit | Attending: Plastic Surgery

## 2012-06-24 ENCOUNTER — Encounter (HOSPITAL_BASED_OUTPATIENT_CLINIC_OR_DEPARTMENT_OTHER): Payer: Self-pay | Admitting: Plastic Surgery

## 2012-06-24 DIAGNOSIS — Z01810 Encounter for preprocedural cardiovascular examination: Secondary | ICD-10-CM | POA: Insufficient documentation

## 2012-06-24 DIAGNOSIS — Z421 Encounter for breast reconstruction following mastectomy: Secondary | ICD-10-CM | POA: Insufficient documentation

## 2012-06-24 DIAGNOSIS — Z6835 Body mass index (BMI) 35.0-35.9, adult: Secondary | ICD-10-CM | POA: Insufficient documentation

## 2012-06-24 DIAGNOSIS — Z01812 Encounter for preprocedural laboratory examination: Secondary | ICD-10-CM | POA: Insufficient documentation

## 2012-06-24 DIAGNOSIS — E119 Type 2 diabetes mellitus without complications: Secondary | ICD-10-CM | POA: Insufficient documentation

## 2012-06-24 DIAGNOSIS — I10 Essential (primary) hypertension: Secondary | ICD-10-CM | POA: Insufficient documentation

## 2012-06-24 DIAGNOSIS — J45909 Unspecified asthma, uncomplicated: Secondary | ICD-10-CM | POA: Insufficient documentation

## 2012-06-24 DIAGNOSIS — Z79899 Other long term (current) drug therapy: Secondary | ICD-10-CM | POA: Insufficient documentation

## 2012-06-24 DIAGNOSIS — Z853 Personal history of malignant neoplasm of breast: Secondary | ICD-10-CM | POA: Insufficient documentation

## 2012-06-24 DIAGNOSIS — Z9010 Acquired absence of unspecified breast and nipple: Secondary | ICD-10-CM | POA: Insufficient documentation

## 2012-06-24 HISTORY — PX: BREAST CAPSULECTOMY WITH IMPLANT EXCHANGE: SHX5592

## 2012-06-24 LAB — GLUCOSE, CAPILLARY: Glucose-Capillary: 71 mg/dL (ref 70–99)

## 2012-06-24 SURGERY — CAPSULECTOMY, BREAST, WITH REPLACEMENT OF IMPLANT
Anesthesia: General | Site: Breast | Laterality: Bilateral | Wound class: Clean

## 2012-06-24 MED ORDER — OXYCODONE HCL 5 MG/5ML PO SOLN: 5.0000 mg | Freq: Once | ORAL | Status: DC | PRN

## 2012-06-24 MED ORDER — SODIUM CHLORIDE 0.9 % IR SOLN
Status: DC | PRN
  Administered 2012-06-24 (×2)

## 2012-06-24 MED ORDER — OXYCODONE HCL 5 MG PO TABS: 5.0000 mg | ORAL_TABLET | Freq: Once | ORAL | Status: DC | PRN

## 2012-06-24 MED ORDER — FENTANYL CITRATE 0.05 MG/ML IJ SOLN
INTRAMUSCULAR | Status: DC | PRN
  Administered 2012-06-24: 25 ug via INTRAVENOUS
  Administered 2012-06-24: 50 ug via INTRAVENOUS
  Administered 2012-06-24: 25 ug via INTRAVENOUS
  Administered 2012-06-24: 50 ug via INTRAVENOUS
  Administered 2012-06-24: 25 ug via INTRAVENOUS

## 2012-06-24 MED ORDER — DEXAMETHASONE SODIUM PHOSPHATE 4 MG/ML IJ SOLN
INTRAMUSCULAR | Status: DC | PRN
  Administered 2012-06-24: 8 mg via INTRAVENOUS

## 2012-06-24 MED ORDER — MIDAZOLAM HCL 5 MG/5ML IJ SOLN
INTRAMUSCULAR | Status: DC | PRN
  Administered 2012-06-24: 2 mg via INTRAVENOUS

## 2012-06-24 MED ORDER — CEFAZOLIN SODIUM-DEXTROSE 2-3 GM-% IV SOLR
INTRAVENOUS | Status: DC | PRN
  Administered 2012-06-24: 2 g via INTRAVENOUS

## 2012-06-24 MED ORDER — HYDROMORPHONE HCL PF 1 MG/ML IJ SOLN
0.2500 mg | INTRAMUSCULAR | Status: DC | PRN
  Administered 2012-06-24: 0.5 mg via INTRAVENOUS

## 2012-06-24 MED ORDER — ACETAMINOPHEN 10 MG/ML IV SOLN
INTRAVENOUS | Status: DC | PRN
  Administered 2012-06-24: 1000 mg via INTRAVENOUS

## 2012-06-24 MED ORDER — LIDOCAINE HCL (CARDIAC) 20 MG/ML IV SOLN
INTRAVENOUS | Status: DC | PRN
  Administered 2012-06-24: 75 mg via INTRAVENOUS

## 2012-06-24 MED ORDER — SUCCINYLCHOLINE CHLORIDE 20 MG/ML IJ SOLN
INTRAMUSCULAR | Status: DC | PRN
  Administered 2012-06-24: 100 mg via INTRAVENOUS

## 2012-06-24 MED ORDER — PROMETHAZINE HCL 25 MG/ML IJ SOLN: 6.2500 mg | INTRAMUSCULAR | Status: DC | PRN

## 2012-06-24 MED ORDER — EPHEDRINE SULFATE 50 MG/ML IJ SOLN
INTRAMUSCULAR | Status: DC | PRN
  Administered 2012-06-24: 15 mg via INTRAVENOUS

## 2012-06-24 MED ORDER — LACTATED RINGERS IV SOLN
INTRAVENOUS | Status: DC
Start: 2012-06-24 — End: 2012-06-24
  Administered 2012-06-24 (×2): via INTRAVENOUS

## 2012-06-24 MED ORDER — MIDAZOLAM HCL 2 MG/2ML IJ SOLN: 0.5000 mg | Freq: Once | INTRAMUSCULAR | Status: DC | PRN

## 2012-06-24 MED ORDER — PROPOFOL 10 MG/ML IV BOLUS
INTRAVENOUS | Status: DC | PRN
  Administered 2012-06-24: 200 mg via INTRAVENOUS

## 2012-06-24 MED ORDER — MEPERIDINE HCL 25 MG/ML IJ SOLN: 6.2500 mg | INTRAMUSCULAR | Status: DC | PRN

## 2012-06-24 SURGICAL SUPPLY — 66 items
BAG DECANTER FOR FLEXI CONT (MISCELLANEOUS) ×4 IMPLANT
BANDAGE GAUZE ELAST BULKY 4 IN (GAUZE/BANDAGES/DRESSINGS) ×4 IMPLANT
BINDER BREAST LRG (GAUZE/BANDAGES/DRESSINGS) ×2 IMPLANT
BINDER BREAST MEDIUM (GAUZE/BANDAGES/DRESSINGS) IMPLANT
BINDER BREAST XLRG (GAUZE/BANDAGES/DRESSINGS) ×2 IMPLANT
BINDER BREAST XXLRG (GAUZE/BANDAGES/DRESSINGS) IMPLANT
BIOPATCH BLUE 3/4IN DISK W/1.5 (GAUZE/BANDAGES/DRESSINGS) IMPLANT
BLADE HEX COATED 2.75 (ELECTRODE) ×2 IMPLANT
BLADE SURG 15 STRL LF DISP TIS (BLADE) ×2 IMPLANT
BLADE SURG 15 STRL SS (BLADE) ×2
CANISTER SUCTION 1200CC (MISCELLANEOUS) ×4 IMPLANT
CHLORAPREP W/TINT 26ML (MISCELLANEOUS) ×2 IMPLANT
CLOTH BEACON ORANGE TIMEOUT ST (SAFETY) ×2 IMPLANT
CORDS BIPOLAR (ELECTRODE) IMPLANT
COVER MAYO STAND STRL (DRAPES) ×2 IMPLANT
COVER TABLE BACK 60X90 (DRAPES) ×2 IMPLANT
DECANTER SPIKE VIAL GLASS SM (MISCELLANEOUS) IMPLANT
DERMABOND ADVANCED (GAUZE/BANDAGES/DRESSINGS) ×2
DERMABOND ADVANCED .7 DNX12 (GAUZE/BANDAGES/DRESSINGS) ×2 IMPLANT
DRAIN CHANNEL 19F RND (DRAIN) IMPLANT
DRAPE LAPAROSCOPIC ABDOMINAL (DRAPES) ×2 IMPLANT
DRSG PAD ABDOMINAL 8X10 ST (GAUZE/BANDAGES/DRESSINGS) ×4 IMPLANT
DRSG TEGADERM 2-3/8X2-3/4 SM (GAUZE/BANDAGES/DRESSINGS) IMPLANT
ELECT BLADE 4.0 EZ CLEAN MEGAD (MISCELLANEOUS) ×2
ELECT BLADE 6.5 .24CM SHAFT (ELECTRODE) IMPLANT
ELECT REM PT RETURN 9FT ADLT (ELECTROSURGICAL) ×2
ELECTRODE BLDE 4.0 EZ CLN MEGD (MISCELLANEOUS) ×1 IMPLANT
ELECTRODE REM PT RTRN 9FT ADLT (ELECTROSURGICAL) ×1 IMPLANT
EVACUATOR SILICONE 100CC (DRAIN) IMPLANT
GAUZE SPONGE 4X4 12PLY STRL LF (GAUZE/BANDAGES/DRESSINGS) IMPLANT
GLOVE BIO SURGEON STRL SZ 6.5 (GLOVE) ×6 IMPLANT
GLOVE SKINSENSE NS SZ6.5 (GLOVE) ×1
GLOVE SKINSENSE NS SZ7.0 (GLOVE) ×1
GLOVE SKINSENSE STRL SZ6.5 (GLOVE) ×1 IMPLANT
GLOVE SKINSENSE STRL SZ7.0 (GLOVE) ×1 IMPLANT
GOWN PREVENTION PLUS XLARGE (GOWN DISPOSABLE) ×6 IMPLANT
IMPL BREAST SILICONE 425CC (Breast) ×2 IMPLANT
IMPLANT BREAST SILICONE 425CC (Breast) ×2 IMPLANT
IMPLANT SILICONE 425CC (Breast) ×2 IMPLANT
IV NS 1000ML (IV SOLUTION)
IV NS 1000ML BAXH (IV SOLUTION) IMPLANT
IV NS 500ML (IV SOLUTION) ×1
IV NS 500ML BAXH (IV SOLUTION) ×1 IMPLANT
KIT FILL MCGHAN 30CC (MISCELLANEOUS) IMPLANT
NDL SAFETY ECLIPSE 18X1.5 (NEEDLE) ×1 IMPLANT
NEEDLE HYPO 18GX1.5 SHARP (NEEDLE) ×1
NEEDLE HYPO 25X1 1.5 SAFETY (NEEDLE) IMPLANT
PACK BASIN DAY SURGERY FS (CUSTOM PROCEDURE TRAY) ×2 IMPLANT
PENCIL BUTTON HOLSTER BLD 10FT (ELECTRODE) ×2 IMPLANT
PIN SAFETY STERILE (MISCELLANEOUS) IMPLANT
SLEEVE SCD COMPRESS KNEE MED (MISCELLANEOUS) ×2 IMPLANT
SPONGE LAP 18X18 X RAY DECT (DISPOSABLE) ×4 IMPLANT
SUT MON AB 5-0 PS2 18 (SUTURE) ×4 IMPLANT
SUT PDS 3-0 CT2 (SUTURE)
SUT PDS AB 2-0 CT2 27 (SUTURE) IMPLANT
SUT PDS II 3-0 CT2 27 ABS (SUTURE) IMPLANT
SUT VIC AB 3-0 SH 27 (SUTURE) ×2
SUT VIC AB 3-0 SH 27X BRD (SUTURE) ×2 IMPLANT
SUT VICRYL 4-0 PS2 18IN ABS (SUTURE) ×4 IMPLANT
SYR 50ML LL SCALE MARK (SYRINGE) IMPLANT
SYR BULB IRRIGATION 50ML (SYRINGE) ×2 IMPLANT
SYR CONTROL 10ML LL (SYRINGE) IMPLANT
TOWEL OR 17X24 6PK STRL BLUE (TOWEL DISPOSABLE) ×4 IMPLANT
TUBE CONNECTING 20X1/4 (TUBING) ×2 IMPLANT
UNDERPAD 30X30 INCONTINENT (UNDERPADS AND DIAPERS) ×4 IMPLANT
YANKAUER SUCT BULB TIP NO VENT (SUCTIONS) ×2 IMPLANT

## 2012-06-24 NOTE — Anesthesia Preprocedure Evaluation (Addendum)
Anesthesia Evaluation  Patient identified by MRN, date of birth, ID band Patient awake    Reviewed: Allergy & Precautions, H&P , NPO status , Patient's Chart, lab work & pertinent test results  History of Anesthesia Complications Negative for: history of anesthetic complications  Airway Mallampati: I TM Distance: >3 FB Neck ROM: Full    Dental No notable dental hx. (+) Teeth Intact and Dental Advisory Given   Pulmonary asthma (no inhaler needed in many months) ,  breath sounds clear to auscultation  Pulmonary exam normal       Cardiovascular hypertension, Pt. on medications - Valvular Problems/MurmursRhythm:Regular Rate:Normal     Neuro/Psych negative neurological ROS     GI/Hepatic Neg liver ROS, GERD-  Medicated and Controlled,  Endo/Other  diabetes, Well Controlled, Type 2, Oral Hypoglycemic AgentsMorbid obesity  Renal/GU      Musculoskeletal   Abdominal (+) + obese,   Peds  Hematology   Anesthesia Other Findings Breast cancer  Reproductive/Obstetrics LMP 2-3 weeks ago                          Anesthesia Physical Anesthesia Plan  ASA: II  Anesthesia Plan: General   Post-op Pain Management:    Induction: Intravenous  Airway Management Planned: Oral ETT  Additional Equipment:   Intra-op Plan:   Post-operative Plan: Extubation in OR  Informed Consent: I have reviewed the patients History and Physical, chart, labs and discussed the procedure including the risks, benefits and alternatives for the proposed anesthesia with the patient or authorized representative who has indicated his/her understanding and acceptance.   Dental advisory given  Plan Discussed with: CRNA and Surgeon  Anesthesia Plan Comments: (Plan routine monitors, GETA)        Anesthesia Quick Evaluation

## 2012-06-24 NOTE — Anesthesia Postprocedure Evaluation (Signed)
  Anesthesia Post-op Note  Patient: Rebecca Rice  Procedure(s) Performed: Procedure(s) (LRB) with comments: BREAST CAPSULECTOMY WITH IMPLANT EXCHANGE (Bilateral) - removal of bilateral breast expanders capsulectomies and placement of implants    Patient Location: PACU  Anesthesia Type:General  Level of Consciousness: awake, alert , oriented and patient cooperative  Airway and Oxygen Therapy: Patient Spontanous Breathing  Post-op Pain: none  Post-op Assessment: Post-op Vital signs reviewed, Patient's Cardiovascular Status Stable, Respiratory Function Stable, Patent Airway, No signs of Nausea or vomiting and Pain level controlled  Post-op Vital Signs: Reviewed and stable  Complications: No apparent anesthesia complications

## 2012-06-24 NOTE — Brief Op Note (Signed)
06/24/2012  3:34 PM  PATIENT:  Rebecca Rice  41 y.o. female  PRE-OPERATIVE DIAGNOSIS:  breast cancer  POST-OPERATIVE DIAGNOSIS:  breast cancer  PROCEDURE:  Procedure(s) (LRB) with comments: BREAST CAPSULECTOMY WITH IMPLANT EXCHANGE (Bilateral) - removal of bilateral breast expanders capsulectomies and placement of implants    SURGEON:  Surgeon(s) and Role:    * Jamita Mckelvin Sanger, DO - Primary  PHYSICIAN ASSISTANT:   ASSISTANTS: Harrie Foreman, LPN   ANESTHESIA:   general  EBL:  Total I/O In: 1000 [I.V.:1000] Out: -   BLOOD ADMINISTERED:none  DRAINS: none   LOCAL MEDICATIONS USED:  NONE  SPECIMEN:  Source of Specimen:  mastectomy scars  DISPOSITION OF SPECIMEN:  PATHOLOGY  COUNTS:  YES  TOURNIQUET:  * No tourniquets in log *  DICTATION: dictated  PLAN OF CARE: Discharge to home after PACU  PATIENT DISPOSITION:  PACU - hemodynamically stable.   Delay start of Pharmacological VTE agent (>24hrs) due to surgical blood loss or risk of bleeding: no

## 2012-06-24 NOTE — H&P (Signed)
This document contains confidential information from a [facility] medical record system and may be unauthenticated. Release may be made only with a valid authorization or in accordance with applicable policies of Medical Center or its affiliates. This document must be maintained in a secure manner or discarded/destroyed as required by Medical Center policy or by a confidential means such as shredding.   Rebecca Rice   06/09/2012 10:45 AM Office Visit  MRN: [id number]  Department: [facility]  Dept Phone: [phone]  Description: Female DOB: 01/09/1971  Provider: Wayland Denis, DO    Primary Coverage      Payor Plan Sponsor Code Group Number Group Name    [employer] [place]   [id number]     Diagnoses     Acquired absence of breast and absent nipple   - Primary    V45.71      Reason for Visit     Breast Reconstruction   Vitals - Last Recorded     166/100  1.524 m (5')  92.987 kg (205 lb)  40.04 kg/m2          Subjective:    Patient ID: Rebecca Rice is a 41 y.o. female.  HPI The patient is a 41 yrs old female diagnoses with left breast multifocal DCIS. She is here for a preoperative history and physical for second stage breast reconstruction.  She underwent first stage bilateral breast reconstruction with expander placement with FlexHD placement 10/17/11.  Her bilateral mastectomies and SLN bx were done 4/12.  Genetic screening was negative for BRCA 1and 2. She is bipolar and has asthma, diabetes, hypertension and BMI >35. She does not use tobacco. Her bra size was a 40D before the surgery. She has not had radiation.  The flaps have good color and cap refill. She currently has 460/350 cc on the left and 420/350 cc on the right.  The following portions of the patient's history were reviewed and updated as appropriate: allergies, current medications, past family history, past medical history, past social history, past surgical history and  problem list.  Review of Systems  Constitutional: Negative.   HENT: Negative.   Eyes: Negative.   Respiratory: Negative.   Cardiovascular: Negative.   Gastrointestinal: Negative.   Genitourinary: Negative.   Musculoskeletal: Negative.   Neurological: Negative.   Hematological: Negative.   Psychiatric/Behavioral: Negative.       Objective:    Physical Exam  Constitutional: She appears well-developed and well-nourished.  HENT:   Head: Normocephalic and atraumatic.  Eyes: Conjunctivae normal and EOM are normal. Pupils are equal, round, and reactive to light.  Neck: Normal range of motion.  Cardiovascular: Normal rate.   Pulmonary/Chest: Effort normal. No respiratory distress. She has no wheezes.  Abdominal: Soft.  Musculoskeletal: Normal range of motion.  Neurological: She is alert.  Skin: Skin is warm.  Psychiatric: She has a normal mood and affect. Her behavior is normal. Judgment and thought content normal.   Assessment:   1.  Acquired absence of breast and absent nipple     Plan:   We are planning on removal of the expanders and placement of the implants.  Risks and complications include bleeding, pain, scar, implant rupture or leak and capsular contracture which we discussed in detail.     Medications Ordered This Encounter      docusate sodium (COLACE) 100 MG capsule Take 1 capsule (100 mg total) by mouth 3 times daily.   cephalexin (KEFLEX) 500  MG capsule Take 1 capsule (500 mg total) by mouth 4 times daily.     diazepam (VALIUM) 2 MG tablet 20 Take 1 tablet (2 mg total) by mouth every 6 (six) hours as needed for 10 days for Anxiety.    promethazine (PHENERGAN) 12.5 MG tablet  Take 2 tablets (25 mg total) by mouth every 6 (six) hours as needed for 7 days for Nausea.

## 2012-06-24 NOTE — Transfer of Care (Signed)
Immediate Anesthesia Transfer of Care Note  Patient: Rebecca Rice  Procedure(s) Performed: Procedure(s) (LRB) with comments: BREAST CAPSULECTOMY WITH IMPLANT EXCHANGE (Bilateral) - removal of bilateral breast expanders capsulectomies and placement of implants    Patient Location: PACU  Anesthesia Type:General  Level of Consciousness: awake  Airway & Oxygen Therapy: Patient Spontanous Breathing and Patient connected to face mask oxygen  Post-op Assessment: Report given to PACU RN and Post -op Vital signs reviewed and stable  Post vital signs: Reviewed and stable  Complications: No apparent anesthesia complications

## 2012-06-24 NOTE — Interval H&P Note (Signed)
History and Physical Interval Note:  06/24/2012 1:14 PM  Rebecca Rice  has presented today for surgery, with the diagnosis of breast cancer  The various methods of treatment have been discussed with the patient and family. After consideration of risks, benefits and other options for treatment, the patient has consented to  Procedure(s) (LRB) with comments: BREAST CAPSULECTOMY WITH IMPLANT EXCHANGE (Bilateral) - removal of bilateral breast expanders capsulectomies and placement of implants   as a surgical intervention .  The patient's history has been reviewed, patient examined, no change in status, stable for surgery.  I have reviewed the patient's chart and labs.  Questions were answered to the patient's satisfaction.     SANGER,CLAIRE

## 2012-06-25 ENCOUNTER — Encounter (HOSPITAL_BASED_OUTPATIENT_CLINIC_OR_DEPARTMENT_OTHER): Payer: Self-pay | Admitting: Plastic Surgery

## 2012-06-25 LAB — GLUCOSE, CAPILLARY: Glucose-Capillary: 90 mg/dL (ref 70–99)

## 2012-06-25 NOTE — Op Note (Signed)
NAMEMarland Kitchen  TONANTZIN, MIMNAUGH NO.:  [id number]  MEDICAL RECORD NO.:  [id number]  LOCATION:[facility]            [facility]  PHYSICIAN:  Wayland Denis, DO      DATE OF BIRTH:  Mar 21, 1971  DATE OF PROCEDURE:  06/24/2012 DATE OF DISCHARGE:                              OPERATIVE REPORT   PREOPERATIVE DIAGNOSIS:  Acquired absence of bilateral breasts secondary to mastectomies for treatment of breast cancer.  POSTOPERATIVE DIAGNOSIS:  Acquired absence of bilateral breasts secondary to mastectomies for treatment of breast cancer.  PROCEDURES:  Removal of bilateral expanders and placement of implants. Implants are Mentor, smooth, round, high-profile gel, reference #[id number], lot number for the left [id number], lot number for the right [id number].  SURGEON:  Wayland Denis, DO  ASSISTANT:  Harrie Foreman, LPN  ANESTHESIA:  General.  INDICATION FOR PROCEDURE:  The patient is a 41 year old female who is diagnosed with breast cancer and underwent reconstruction with expander placement.  She presents now for exchange.  Risks and complications were reviewed and included bleeding, pain, scar, risk of anesthesia, capsular contracture, loss of implant, and infection, and asymmetry.  DESCRIPTION OF PROCEDURE:  The patient was taken to the operating room, placed on the operating room table in supine position.  General anesthesia was administered.  Once adequate, a time-out was called.  All information was confirmed to be correct.  She was prepped and draped in the usual sterile fashion.  Elliptical incision was made around the previous mastectomy scar.  We started on the right side.  The scar was sent to Pathology.  The Bovie was then used to dissect down to the expander in a graduated fashion towards the inferior aspect, so that the repair of the muscle would not be directly under the skin incision.  The expander was exposed.  The fluid was evacuated and the  expander was removed.  Capsulotomies were done superior, medial, and inferior, but not laterally.  The capsule was scored to help with symmetry and stretch.  Once that was done, the pocket was irrigated with antibiotic solution.  The 425 mL Mentor smooth, round, high-profile gel implant was selected, prepared according to the manufacturer's guidelines and placed in the pocket.  The muscle layer was closed with 3-0 Vicryl followed by 4-0 Vicryl and a running subcuticular 5-0 Monocryl was used to close the skin.  Dermabond was applied.  The same exact procedure was done on the opposite side.  After the Dermabond was applied, the ABDs and a breast binder were placed. The patient tolerated the procedure well.  There were no complications. She was allowed to wake up, was extubated, and taken to the recovery room in stable condition.     Wayland Denis, DO     CS/MEDQ  D:  06/24/2012  T:  06/25/2012  Job:  [id number]

## 2012-07-24 ENCOUNTER — Ambulatory Visit (INDEPENDENT_AMBULATORY_CARE_PROVIDER_SITE_OTHER): Payer: BC Managed Care – PPO | Admitting: Surgery

## 2012-07-24 ENCOUNTER — Encounter (INDEPENDENT_AMBULATORY_CARE_PROVIDER_SITE_OTHER): Payer: Self-pay | Admitting: Surgery

## 2012-07-24 VITALS — BP 132/80 | HR 74 | Temp 97.8°F | Resp 18 | Ht 64.0 in | Wt 199.5 lb

## 2012-07-24 DIAGNOSIS — Z853 Personal history of malignant neoplasm of breast: Secondary | ICD-10-CM

## 2012-07-24 NOTE — Patient Instructions (Addendum)
Return 6 months

## 2012-07-24 NOTE — Progress Notes (Signed)
Patient ID: Rebecca Rice, female   DOB: 20-Jun-1971, 41 y.o.   MRN: [id number]  Chief Complaint  Patient presents with  . Breast Cancer Long Term Follow Up    HPI Rebecca Rice is a 41 y.o. female.  HPI The patient returns today for follow up of DCIS. She is 18 months out from bilateral mastectomy.   She complains of joint pain.   Past Medical History  Diagnosis Date  . Cancer     breast  . Diabetes mellitus     NIDDM  . Asthma     daily and prn inhalers; triggered by environmental allergies  . Arthritis     knees, ankles, hips  . GERD (gastroesophageal reflux disease)     OTC as needed  . Bipolar disorder   . Heart murmur     states no known problems  . Hypertension     under control; has been on med. > 12 yrs.  [name] TIA (transient ischemic attack) age 86    TIA vs. Bell's palsy(was undetermined) - no residual deficits    Past Surgical History  Procedure Date  . Foot surgery 01/2010; 1998  . Mastectomy 11/21/2010    bilat. simple mastect.  Dewaine Conger 2002  . Wisdom tooth extraction age 24  . Breast reconstruction 10/17/2011    Procedure: BREAST RECONSTRUCTION;  Surgeon: Wayland Denis, DO;  Location: [facility];  Service: Plastics;  Laterality: Bilateral;  bilateral breast reconstruction with expander and flex hd placement  . Breast capsulectomy with implant exchange 06/24/2012    Procedure: BREAST CAPSULECTOMY WITH IMPLANT EXCHANGE;  Surgeon: Wayland Denis, DO;  Location:  SURGERY CENTER;  Service: Plastics;  Laterality: Bilateral;  removal of bilateral breast expanders capsulectomies and placement of implants      History reviewed. No pertinent family history.  Social History History  Substance Use Topics  . Smoking status: Never Smoker   . Smokeless tobacco: Never Used  . Alcohol Use: Yes     Comment: rarely    Allergies  Allergen Reactions  . Decongestant Formula Other (See Comments)    TACHYCARDIA  . Vicodin (Hydrocodone-Acetaminophen)  Itching  . Adhesive (Tape) Rash    Current Outpatient Prescriptions  Medication Sig Dispense Refill  . albuterol (PROVENTIL HFA;VENTOLIN HFA) 108 (90 BASE) MCG/ACT inhaler Inhale 2 puffs into the lungs every 6 (six) hours as needed.      . cetirizine (ZYRTEC) 10 MG tablet Take 10 mg by mouth daily.      . cloNIDine-chlorthalidone (CLORPRES) 0.1-15 MG per tablet Take 10 tablets by mouth 2 (two) times daily.       . divalproex (DEPAKOTE ER) 500 MG 24 hr tablet Take 2,000 mg by mouth daily. PM      . glipiZIDE (GLUCOTROL) 5 MG tablet Take 10 mg by mouth 2 (two) times daily before a meal.       . losartan-hydrochlorothiazide (HYZAAR) 100-25 MG per tablet Take 1 tablet by mouth daily. AM      . metFORMIN (GLUCOPHAGE) 500 MG tablet Take 500 mg by mouth 2 (two) times daily with a meal.      . mometasone (ASMANEX) 220 MCG/INH inhaler Inhale 220 puffs into the lungs 2 (two) times daily.       . montelukast (SINGULAIR) 10 MG tablet Take 10 mg by mouth at bedtime.       . Multiple Vitamin (MULTIVITAMIN) tablet Take 1 tablet by mouth daily.      . potassium  chloride (K-DUR,KLOR-CON) 10 MEQ tablet Take 10 mEq by mouth 3 (three) times a week. M,W,F - MORNING      . ranitidine (ZANTAC) 150 MG tablet Take 1 tablet (150 mg total) by mouth 2 (two) times daily.  20 tablet  0  . sertraline (ZOLOFT) 25 MG tablet Take 25 mg by mouth daily. AM      . [DISCONTINUED] benazepril-hydrochlorthiazide (LOTENSIN HCT) 20-25 MG per tablet Take 1 tablet by mouth 2 (two) times daily.        . [DISCONTINUED] metoprolol (LOPRESSOR) 50 MG tablet Take 50 mg by mouth 2 (two) times daily.        . [DISCONTINUED] metoprolol (TOPROL-XL) 50 MG 24 hr tablet Take 50 mg by mouth daily.          Review of Systems Review of Systems  Constitutional: Negative.   HENT: Negative.   Respiratory: Negative.   Cardiovascular: Negative.   Gastrointestinal: Negative.   Musculoskeletal: Positive for arthralgias.  Skin: Negative.     Blood  pressure 132/80, pulse 74, temperature 97.8 F (36.6 C), temperature source Temporal, resp. rate 18, height 5\' 4"  (1.626 m), weight 199 lb 8 oz (90.493 kg).  Physical Exam Physical Exam  Constitutional: She appears well-developed and well-nourished.  HENT:  Head: Normocephalic and atraumatic.  Eyes: EOM are normal. Pupils are equal, round, and reactive to light.  Neck: Normal range of motion. Neck supple.  Pulmonary/Chest: Effort normal and breath sounds normal. She has no wheezes.       Both breast surgicly absent.  Some hypertrophic scarring.  No masses or axillary adenopathy.  reconstructions in place Lymphadenopathy:    She has no cervical adenopathy.    Data Reviewed   Assessment    History Of DCIS S/P  B mastectomy April 2012.     Plan      Follow up 6 months.  Marland Kitchenask radiology about MRI follow up in this patient population       Gianne Shugars A. 07/24/2012, 12:09 PM

## 2012-11-13 ENCOUNTER — Encounter (INDEPENDENT_AMBULATORY_CARE_PROVIDER_SITE_OTHER): Payer: Self-pay | Admitting: Surgery

## 2012-12-21 ENCOUNTER — Encounter (INDEPENDENT_AMBULATORY_CARE_PROVIDER_SITE_OTHER): Payer: Self-pay

## 2013-06-25 ENCOUNTER — Other Ambulatory Visit: Payer: Self-pay | Admitting: Internal Medicine

## 2013-06-25 ENCOUNTER — Encounter (HOSPITAL_BASED_OUTPATIENT_CLINIC_OR_DEPARTMENT_OTHER): Payer: Self-pay | Admitting: *Deleted

## 2013-06-25 ENCOUNTER — Other Ambulatory Visit: Payer: Self-pay

## 2013-06-25 ENCOUNTER — Encounter (HOSPITAL_BASED_OUTPATIENT_CLINIC_OR_DEPARTMENT_OTHER)
Admission: RE | Admit: 2013-06-25 | Discharge: 2013-06-25 | Disposition: A | Discharge status: Home / Self Care | Payer: BC Managed Care – PPO | Source: Ambulatory Visit | Attending: Specialist | Admitting: Specialist

## 2013-06-25 ENCOUNTER — Ambulatory Visit
Admission: RE | Admit: 2013-06-25 | Discharge: 2013-06-25 | Disposition: A | Discharge status: Home / Self Care | Payer: BC Managed Care – PPO | Source: Ambulatory Visit | Attending: Internal Medicine | Admitting: Internal Medicine

## 2013-06-25 DIAGNOSIS — M79672 Pain in left foot: Secondary | ICD-10-CM

## 2013-06-25 DIAGNOSIS — Z01812 Encounter for preprocedural laboratory examination: Secondary | ICD-10-CM | POA: Insufficient documentation

## 2013-06-25 DIAGNOSIS — Z01818 Encounter for other preprocedural examination: Secondary | ICD-10-CM | POA: Insufficient documentation

## 2013-06-25 DIAGNOSIS — Z01810 Encounter for preprocedural cardiovascular examination: Secondary | ICD-10-CM | POA: Insufficient documentation

## 2013-06-25 LAB — BASIC METABOLIC PANEL
BUN: 11 mg/dL (ref 6–23)
Calcium: 9.8 mg/dL (ref 8.4–10.5)
Creatinine, Ser: 0.88 mg/dL (ref 0.50–1.10)
GFR calc Af Amer: >90 mL/min (ref >90)
GFR calc non Af Amer: 80 mL/min — ABNORMAL LOW (ref >90)

## 2013-06-25 NOTE — Progress Notes (Signed)
Pt was here for tissue expanders and implant put in . Did well Is having implants exchg and more scar tissue-different physician- To come in for ekg and bmet

## 2013-07-05 ENCOUNTER — Ambulatory Visit (HOSPITAL_BASED_OUTPATIENT_CLINIC_OR_DEPARTMENT_OTHER)
Admission: RE | Admit: 2013-07-05 | Discharge: 2013-07-05 | Disposition: A | Discharge status: Home / Self Care | Payer: BC Managed Care – PPO | Source: Ambulatory Visit | Attending: Specialist | Admitting: Specialist

## 2013-07-05 ENCOUNTER — Encounter (HOSPITAL_BASED_OUTPATIENT_CLINIC_OR_DEPARTMENT_OTHER): Payer: BC Managed Care – PPO | Admitting: Anesthesiology

## 2013-07-05 ENCOUNTER — Ambulatory Visit (HOSPITAL_BASED_OUTPATIENT_CLINIC_OR_DEPARTMENT_OTHER): Payer: BC Managed Care – PPO | Admitting: Anesthesiology

## 2013-07-05 ENCOUNTER — Encounter (HOSPITAL_BASED_OUTPATIENT_CLINIC_OR_DEPARTMENT_OTHER)
Admission: RE | Disposition: A | Discharge status: Home / Self Care | Payer: Self-pay | Source: Ambulatory Visit | Attending: Specialist

## 2013-07-05 ENCOUNTER — Encounter (HOSPITAL_BASED_OUTPATIENT_CLINIC_OR_DEPARTMENT_OTHER): Payer: Self-pay | Admitting: Anesthesiology

## 2013-07-05 DIAGNOSIS — J45909 Unspecified asthma, uncomplicated: Secondary | ICD-10-CM | POA: Insufficient documentation

## 2013-07-05 DIAGNOSIS — Z853 Personal history of malignant neoplasm of breast: Secondary | ICD-10-CM | POA: Insufficient documentation

## 2013-07-05 DIAGNOSIS — Z9010 Acquired absence of unspecified breast and nipple: Secondary | ICD-10-CM | POA: Insufficient documentation

## 2013-07-05 DIAGNOSIS — L905 Scar conditions and fibrosis of skin: Secondary | ICD-10-CM | POA: Insufficient documentation

## 2013-07-05 DIAGNOSIS — E119 Type 2 diabetes mellitus without complications: Secondary | ICD-10-CM | POA: Insufficient documentation

## 2013-07-05 DIAGNOSIS — Z8673 Personal history of transient ischemic attack (TIA), and cerebral infarction without residual deficits: Secondary | ICD-10-CM | POA: Insufficient documentation

## 2013-07-05 DIAGNOSIS — I10 Essential (primary) hypertension: Secondary | ICD-10-CM | POA: Insufficient documentation

## 2013-07-05 DIAGNOSIS — F319 Bipolar disorder, unspecified: Secondary | ICD-10-CM | POA: Insufficient documentation

## 2013-07-05 DIAGNOSIS — N651 Disproportion of reconstructed breast: Secondary | ICD-10-CM | POA: Insufficient documentation

## 2013-07-05 DIAGNOSIS — K219 Gastro-esophageal reflux disease without esophagitis: Secondary | ICD-10-CM | POA: Insufficient documentation

## 2013-07-05 DIAGNOSIS — M129 Arthropathy, unspecified: Secondary | ICD-10-CM | POA: Insufficient documentation

## 2013-07-05 HISTORY — PX: SCAR REVISION: SHX5285

## 2013-07-05 HISTORY — DX: Presence of spectacles and contact lenses: Z97.3

## 2013-07-05 HISTORY — PX: BREAST IMPLANT EXCHANGE: SHX6296

## 2013-07-05 LAB — GLUCOSE, CAPILLARY
Glucose-Capillary: 110 mg/dL — ABNORMAL HIGH (ref 70–99)
Glucose-Capillary: 129 mg/dL — ABNORMAL HIGH (ref 70–99)

## 2013-07-05 SURGERY — REPLACEMENT, IMPLANT, BREAST
Anesthesia: General | Site: Breast | Laterality: Bilateral | Wound class: Clean

## 2013-07-05 MED ORDER — MIDAZOLAM HCL 2 MG/2ML IJ SOLN
INTRAMUSCULAR | Status: AC
  Filled 2013-07-05: qty 2

## 2013-07-05 MED ORDER — LIDOCAINE-EPINEPHRINE 0.5 %-1:200000 IJ SOLN
INTRAMUSCULAR | Status: AC
  Filled 2013-07-05: qty 2

## 2013-07-05 MED ORDER — CEFAZOLIN SODIUM-DEXTROSE 2-3 GM-% IV SOLR
2.0000 g | INTRAVENOUS | Status: AC
  Administered 2013-07-05: 2 g via INTRAVENOUS

## 2013-07-05 MED ORDER — EPHEDRINE SULFATE 50 MG/ML IJ SOLN
INTRAMUSCULAR | Status: DC | PRN
  Administered 2013-07-05: 10 mg via INTRAVENOUS

## 2013-07-05 MED ORDER — PROPOFOL 10 MG/ML IV BOLUS
INTRAVENOUS | Status: AC
  Filled 2013-07-05: qty 20

## 2013-07-05 MED ORDER — LACTATED RINGERS IV SOLN
INTRAVENOUS | Status: DC
  Administered 2013-07-05 (×3): via INTRAVENOUS

## 2013-07-05 MED ORDER — LIDOCAINE HCL (CARDIAC) 20 MG/ML IV SOLN
INTRAVENOUS | Status: DC | PRN
  Administered 2013-07-05: 100 mg via INTRAVENOUS

## 2013-07-05 MED ORDER — GLYCOPYRROLATE 0.2 MG/ML IJ SOLN
INTRAMUSCULAR | Status: DC | PRN
  Administered 2013-07-05: 0.4 mg via INTRAVENOUS

## 2013-07-05 MED ORDER — PROMETHAZINE HCL 25 MG/ML IJ SOLN: 6.2500 mg | INTRAMUSCULAR | Status: DC | PRN

## 2013-07-05 MED ORDER — OXYCODONE HCL 5 MG PO TABS
5.0000 mg | ORAL_TABLET | Freq: Once | ORAL | Status: AC | PRN
  Administered 2013-07-05: 5 mg via ORAL

## 2013-07-05 MED ORDER — ROCURONIUM BROMIDE 100 MG/10ML IV SOLN
INTRAVENOUS | Status: DC | PRN
  Administered 2013-07-05: 50 mg via INTRAVENOUS
  Administered 2013-07-05: 10 mg via INTRAVENOUS

## 2013-07-05 MED ORDER — CEFAZOLIN SODIUM 1 G IJ SOLR
INTRAMUSCULAR | Status: DC | PRN
  Administered 2013-07-05: 2 g

## 2013-07-05 MED ORDER — PROPOFOL 10 MG/ML IV BOLUS
INTRAVENOUS | Status: DC | PRN
  Administered 2013-07-05: 170 mg via INTRAVENOUS

## 2013-07-05 MED ORDER — MIDAZOLAM HCL 5 MG/5ML IJ SOLN
INTRAMUSCULAR | Status: DC | PRN
  Administered 2013-07-05: 2 mg via INTRAVENOUS

## 2013-07-05 MED ORDER — NEOSTIGMINE METHYLSULFATE 1 MG/ML IJ SOLN
INTRAMUSCULAR | Status: DC | PRN
  Administered 2013-07-05: 3 mg via INTRAVENOUS

## 2013-07-05 MED ORDER — DEXAMETHASONE SODIUM PHOSPHATE 4 MG/ML IJ SOLN
INTRAMUSCULAR | Status: DC | PRN
  Administered 2013-07-05: 5 mg via INTRAVENOUS

## 2013-07-05 MED ORDER — HYDROMORPHONE HCL PF 1 MG/ML IJ SOLN
0.2500 mg | INTRAMUSCULAR | Status: DC | PRN
  Administered 2013-07-05: 0.5 mg via INTRAVENOUS
  Administered 2013-07-05 (×2): 0.25 mg via INTRAVENOUS

## 2013-07-05 MED ORDER — HYDROMORPHONE HCL PF 1 MG/ML IJ SOLN
INTRAMUSCULAR | Status: AC
  Filled 2013-07-05: qty 1

## 2013-07-05 MED ORDER — OXYCODONE HCL 5 MG/5ML PO SOLN: 5.0000 mg | Freq: Once | ORAL | Status: AC | PRN

## 2013-07-05 MED ORDER — LIDOCAINE-EPINEPHRINE 0.5 %-1:200000 IJ SOLN
INTRAMUSCULAR | Status: DC | PRN
  Administered 2013-07-05: 100 mL

## 2013-07-05 MED ORDER — CEFAZOLIN SODIUM-DEXTROSE 2-3 GM-% IV SOLR
INTRAVENOUS | Status: AC
  Filled 2013-07-05: qty 50

## 2013-07-05 MED ORDER — FENTANYL CITRATE 0.05 MG/ML IJ SOLN
INTRAMUSCULAR | Status: AC
  Filled 2013-07-05: qty 6

## 2013-07-05 MED ORDER — ONDANSETRON HCL 4 MG/2ML IJ SOLN
INTRAMUSCULAR | Status: DC | PRN
  Administered 2013-07-05: 4 mg via INTRAVENOUS

## 2013-07-05 MED ORDER — OXYCODONE HCL 5 MG PO TABS
ORAL_TABLET | ORAL | Status: AC
  Filled 2013-07-05: qty 1

## 2013-07-05 MED ORDER — FENTANYL CITRATE 0.05 MG/ML IJ SOLN
INTRAMUSCULAR | Status: DC | PRN
  Administered 2013-07-05 (×3): 50 ug via INTRAVENOUS

## 2013-07-05 SURGICAL SUPPLY — 82 items
BAG DECANTER FOR FLEXI CONT (MISCELLANEOUS) ×2 IMPLANT
BANDAGE ELASTIC 4 VELCRO ST LF (GAUZE/BANDAGES/DRESSINGS) IMPLANT
BANDAGE GAUZE ELAST BULKY 4 IN (GAUZE/BANDAGES/DRESSINGS) IMPLANT
BENZOIN TINCTURE PRP APPL 2/3 (GAUZE/BANDAGES/DRESSINGS) ×4 IMPLANT
BINDER BREAST LRG (GAUZE/BANDAGES/DRESSINGS) IMPLANT
BINDER BREAST MEDIUM (GAUZE/BANDAGES/DRESSINGS) IMPLANT
BINDER BREAST XLRG (GAUZE/BANDAGES/DRESSINGS) ×2 IMPLANT
BINDER BREAST XXLRG (GAUZE/BANDAGES/DRESSINGS) IMPLANT
BLADE KNIFE PERSONA 10 (BLADE) ×2 IMPLANT
BLADE KNIFE PERSONA 15 (BLADE) ×2 IMPLANT
BNDG COHESIVE 4X5 TAN STRL (GAUZE/BANDAGES/DRESSINGS) IMPLANT
CANISTER SUCT 1200ML W/VALVE (MISCELLANEOUS) ×2 IMPLANT
COVER MAYO STAND STRL (DRAPES) ×2 IMPLANT
COVER TABLE BACK 60X90 (DRAPES) ×2 IMPLANT
DECANTER SPIKE VIAL GLASS SM (MISCELLANEOUS) IMPLANT
DRAIN CHANNEL 10M FLAT 3/4 FLT (DRAIN) ×2 IMPLANT
DRAIN PENROSE 1/4X12 LTX STRL (WOUND CARE) IMPLANT
DRAPE LAPAROSCOPIC ABDOMINAL (DRAPES) ×2 IMPLANT
DRAPE LAPAROTOMY TRNSV 102X78 (DRAPE) IMPLANT
DRAPE PED LAPAROTOMY (DRAPES) IMPLANT
DRAPE U-SHAPE 76X120 STRL (DRAPES) IMPLANT
DRSG PAD ABDOMINAL 8X10 ST (GAUZE/BANDAGES/DRESSINGS) ×8 IMPLANT
ELECT BLADE 6.5 .24CM SHAFT (ELECTRODE) IMPLANT
ELECT NEEDLE TIP 2.8 STRL (NEEDLE) IMPLANT
ELECT REM PT RETURN 9FT ADLT (ELECTROSURGICAL) ×2
ELECTRODE REM PT RTRN 9FT ADLT (ELECTROSURGICAL) ×1 IMPLANT
EVACUATOR SILICONE 100CC (DRAIN) ×2 IMPLANT
FILTER 7/8 IN (FILTER) ×2 IMPLANT
GAUZE SPONGE 4X4 16PLY XRAY LF (GAUZE/BANDAGES/DRESSINGS) IMPLANT
GAUZE XEROFORM 1X8 LF (GAUZE/BANDAGES/DRESSINGS) IMPLANT
GAUZE XEROFORM 5X9 LF (GAUZE/BANDAGES/DRESSINGS) ×2 IMPLANT
GLOVE BIO SURGEON STRL SZ7 (GLOVE) ×2 IMPLANT
GLOVE BIOGEL M STRL SZ7.5 (GLOVE) ×2 IMPLANT
GLOVE BIOGEL PI IND STRL 8 (GLOVE) ×1 IMPLANT
GLOVE BIOGEL PI INDICATOR 8 (GLOVE) ×1
GLOVE ECLIPSE 7.0 STRL STRAW (GLOVE) ×2 IMPLANT
GOWN PREVENTION PLUS XLARGE (GOWN DISPOSABLE) IMPLANT
GOWN PREVENTION PLUS XXLARGE (GOWN DISPOSABLE) ×4 IMPLANT
IMPL SALINE SMOOTH RND 575 (Breast) ×2 IMPLANT
IMPLANT SALINE SMOOTH RND 575 (Breast) ×4 IMPLANT
IV NS 500ML (IV SOLUTION) ×4
IV NS 500ML BAXH (IV SOLUTION) ×4 IMPLANT
KIT FILL SYSTEM UNIVERSAL (SET/KITS/TRAYS/PACK) ×4 IMPLANT
NEEDLE HYPO 25X1 1.5 SAFETY (NEEDLE) IMPLANT
NEEDLE SPNL 18GX3.5 QUINCKE PK (NEEDLE) ×2 IMPLANT
NS IRRIG 1000ML POUR BTL (IV SOLUTION) IMPLANT
PACK BASIN DAY SURGERY FS (CUSTOM PROCEDURE TRAY) ×2 IMPLANT
PEN SKIN MARKING BROAD TIP (MISCELLANEOUS) ×2 IMPLANT
PIN SAFETY STERILE (MISCELLANEOUS) IMPLANT
SET ASEPTIC TRANSFER (MISCELLANEOUS) ×4 IMPLANT
SHEET MEDIUM DRAPE 40X70 STRL (DRAPES) IMPLANT
SHEETING SILICONE GEL EPI DERM (MISCELLANEOUS) IMPLANT
SLEEVE SCD COMPRESS KNEE MED (MISCELLANEOUS) ×2 IMPLANT
SPONGE GAUZE 4X4 12PLY (GAUZE/BANDAGES/DRESSINGS) ×2 IMPLANT
SPONGE LAP 18X18 X RAY DECT (DISPOSABLE) ×8 IMPLANT
STOCKINETTE IMPERVIOUS LG (DRAPES) IMPLANT
STRIP SUTURE WOUND CLOSURE 1/2 (SUTURE) ×8 IMPLANT
SUT FIBERWIRE 3-0 18 DIAM 3/8 (SUTURE)
SUT MNCRL AB 3-0 PS2 18 (SUTURE) ×4 IMPLANT
SUT MON AB 2-0 CT1 36 (SUTURE) ×6 IMPLANT
SUT MON AB 4-0 PC3 18 (SUTURE) IMPLANT
SUT MON AB 5-0 PS2 18 (SUTURE) IMPLANT
SUT PROLENE 3 0 PS 2 (SUTURE) IMPLANT
SUT PROLENE 4 0 PS 2 18 (SUTURE) IMPLANT
SUT PROLENE 5 0 P 3 (SUTURE) IMPLANT
SUT PROLENE 5 0 PS 2 (SUTURE) IMPLANT
SUT PROLENE 6 0 P 1 18 (SUTURE) IMPLANT
SUTURE FIBERWR 3-0 18 DIAM 3/8 (SUTURE) IMPLANT
SYR 20CC LL (SYRINGE) IMPLANT
SYR 50ML LL SCALE MARK (SYRINGE) ×4 IMPLANT
SYR BULB IRRIGATION 50ML (SYRINGE) ×2 IMPLANT
SYR CONTROL 10ML LL (SYRINGE) IMPLANT
TAPE HYPAFIX 6X30 (GAUZE/BANDAGES/DRESSINGS) ×2 IMPLANT
TAPE MEASURE 72IN RETRACT (INSTRUMENTS)
TAPE MEASURE LINEN 72IN RETRCT (INSTRUMENTS) IMPLANT
TAPE PAPER MEDFIX 1IN X 10YD (GAUZE/BANDAGES/DRESSINGS) IMPLANT
TOWEL OR 17X24 6PK STRL BLUE (TOWEL DISPOSABLE) ×8 IMPLANT
TRAY DSU PREP LF (CUSTOM PROCEDURE TRAY) ×2 IMPLANT
TUBE CONNECTING 20X1/4 (TUBING) ×2 IMPLANT
UNDERPAD 30X30 INCONTINENT (UNDERPADS AND DIAPERS) ×4 IMPLANT
VAC PENCILS W/TUBING CLEAR (MISCELLANEOUS) ×2 IMPLANT
YANKAUER SUCT BULB TIP NO VENT (SUCTIONS) ×2 IMPLANT

## 2013-07-05 NOTE — Anesthesia Postprocedure Evaluation (Signed)
  Anesthesia Post-op Note  Patient: Rebecca Rice  Procedure(s) Performed: Procedure(s): REMOVAL OF BILATERAL IMPLANTS REPLACEMENT OF BILATERAL IMPLANTS BILATERAL   (Bilateral) SCAR REVISION BILATERAL EXCISION OF SEVERE EXCESSIVE BREAST TISSUE  (Bilateral)  Patient Location: PACU  Anesthesia Type:General  Level of Consciousness: awake  Airway and Oxygen Therapy: Patient Spontanous Breathing  Post-op Pain: mild  Post-op Assessment: Post-op Vital signs reviewed  Post-op Vital Signs: stable  Complications: No apparent anesthesia complications

## 2013-07-05 NOTE — Transfer of Care (Signed)
Immediate Anesthesia Transfer of Care Note  Patient: Rebecca Rice  Procedure(s) Performed: Procedure(s): REMOVAL OF BILATERAL IMPLANTS REPLACEMENT OF BILATERAL IMPLANTS BILATERAL   (Bilateral) SCAR REVISION BILATERAL EXCISION OF SEVERE EXCESSIVE BREAST TISSUE  (Bilateral)  Patient Location: PACU  Anesthesia Type:General  Level of Consciousness: sedated  Airway & Oxygen Therapy: Patient Spontanous Breathing and Patient connected to face mask oxygen  Post-op Assessment: Report given to PACU RN and Post -op Vital signs reviewed and stable  Post vital signs: Reviewed and stable  Complications: No apparent anesthesia complications

## 2013-07-05 NOTE — Anesthesia Preprocedure Evaluation (Signed)
Anesthesia Evaluation  Patient identified by MRN, date of birth, ID band Patient awake    Reviewed: Allergy & Precautions, H&P , NPO status , Patient's Chart, lab work & pertinent test results  History of Anesthesia Complications Negative for: history of anesthetic complications  Airway Mallampati: I  Neck ROM: Full    Dental  (+) Teeth Intact   Pulmonary asthma ,  breath sounds clear to auscultation        Cardiovascular hypertension, Rhythm:Regular Rate:Normal     Neuro/Psych TIA   GI/Hepatic GERD-  ,  Endo/Other  diabetes, Well Controlled  Renal/GU      Musculoskeletal   Abdominal   Peds  Hematology   Anesthesia Other Findings   Reproductive/Obstetrics                           Anesthesia Physical Anesthesia Plan  ASA: III  Anesthesia Plan: General   Post-op Pain Management:    Induction: Intravenous  Airway Management Planned: Oral ETT  Additional Equipment:   Intra-op Plan:   Post-operative Plan: Extubation in OR  Informed Consent: I have reviewed the patients History and Physical, chart, labs and discussed the procedure including the risks, benefits and alternatives for the proposed anesthesia with the patient or authorized representative who has indicated his/her understanding and acceptance.   Dental advisory given  Plan Discussed with: CRNA and Surgeon  Anesthesia Plan Comments:         Anesthesia Quick Evaluation

## 2013-07-05 NOTE — H&P (Signed)
Rebecca Rice is an 42 y.o. female.   Chief Complaint: SP bilateral mastectomies and reconstructions with increased deformity and scarring HPI:SP as above Pt now has  Deformities of the reconstructions with assymmetry and severe scarring bilaterally and capsular formations Baker 2 to 3. Had much difficulty healing the right breast vreconstruction initially  Past Medical History  Diagnosis Date  . Cancer     breast  . Diabetes mellitus     NIDDM  . Asthma     daily and prn inhalers; triggered by environmental allergies  . Arthritis     knees, ankles, hips  . GERD (gastroesophageal reflux disease)     OTC as needed  . Bipolar disorder   . Heart murmur     states no known problems  . Hypertension     under control; has been on med. > 12 yrs.  [name] TIA (transient ischemic attack) age 89    TIA vs. Bell's palsy(was undetermined) - no residual deficits  . Wears glasses     Past Surgical History  Procedure Laterality Date  . Foot surgery  01/2010; 1998  . Lasik  2002  . Wisdom tooth extraction  age 10  . Breast reconstruction  10/17/2011    Procedure: BREAST RECONSTRUCTION;  Surgeon: Wayland Denis, DO;  Location: [facility];  Service: Plastics;  Laterality: Bilateral;  bilateral breast reconstruction with expander and flex hd placement  . Breast capsulectomy with implant exchange  06/24/2012    Procedure: BREAST CAPSULECTOMY WITH IMPLANT EXCHANGE;  Surgeon: Wayland Denis, DO;  Location: [facility];  Service: Plastics;  Laterality: Bilateral;  removal of bilateral breast expanders capsulectomies and placement of implants    . Mastectomy  11/21/2010    bilat. simple mastect.lt snbx    History reviewed. No pertinent family history. Social History:  reports that she has never smoked. She has never used smokeless tobacco. She reports that she drinks alcohol. She reports that she does not use illicit drugs.  Allergies:  Allergies  Allergen Reactions  .  Decongestant Formula Other (See Comments)    TACHYCARDIA  . Vicodin [Hydrocodone-Acetaminophen] Itching  . Adhesive [Tape] Rash    Medications Prior to Admission  Medication Sig Dispense Refill  . risperidone (RISPERDAL) 4 MG tablet Take 4 mg by mouth every evening.       [name] albuterol (PROVENTIL HFA;VENTOLIN HFA) 108 (90 BASE) MCG/ACT inhaler Inhale 2 puffs into the lungs every 6 (six) hours as needed.      . cetirizine (ZYRTEC) 10 MG tablet Take 10 mg by mouth daily.      . cloNIDine-chlorthalidone (CLORPRES) 0.1-15 MG per tablet Take 10 tablets by mouth 2 (two) times daily.       [name] glipiZIDE (GLUCOTROL) 5 MG tablet Take 10 mg by mouth 2 (two) times daily before a meal.       . losartan-hydrochlorothiazide (HYZAAR) 100-25 MG per tablet Take 1 tablet by mouth daily. AM      . metFORMIN (GLUCOPHAGE) 500 MG tablet Take 500 mg by mouth 2 (two) times daily with a meal.      . Multiple Vitamin (MULTIVITAMIN) tablet Take 1 tablet by mouth daily.        No results found for this or any previous visit (from the past 48 hour(s)). No results found.  Review of Systems  Constitutional: Negative.   Eyes: Negative.   Respiratory: Negative.   Cardiovascular: Negative.   Gastrointestinal: Positive for heartburn.  Genitourinary: Negative.  Musculoskeletal: Positive for myalgias and neck pain.  Skin: Positive for rash.  Neurological: Positive for headaches.  Endo/Heme/Allergies: Negative.   Psychiatric/Behavioral: Positive for depression. The patient is nervous/anxious.     Blood pressure 111/74, pulse 99, temperature 98.4 F (36.9 C), temperature source Oral, resp. rate 18, height 5\' 4"  (1.626 m), weight 90.719 kg (200 lb), last menstrual period 06/03/2013, SpO2 99.00%. Physical Exam   Assessment/Plan For revision of both breast reconstructions, bilateral capsulectomies or capsulotomies with implant exchanges and 1st staged scar excisions and plastic reconstructions DX  Is bilateral breast  cancer  Tigerlily Christine L 07/05/2013, 10:39 AM

## 2013-07-05 NOTE — Brief Op Note (Signed)
07/05/2013  1:08 PM  PATIENT:  Rebecca Rice  42 y.o. female  PRE-OPERATIVE DIAGNOSIS:  HX OF BREAST CANCER, SKIN CONDITION FIBROSIS OF SKIN  POST-OPERATIVE DIAGNOSIS:  HX OF BREAST CANCER, SKIN CONDITION FIBROSIS OF SKIN  PROCEDURE:  Procedure(s): REMOVAL OF BILATERAL IMPLANTS REPLACEMENT OF BILATERAL IMPLANTS BILATERAL   (Bilateral) SCAR REVISION BILATERAL EXCISION OF SEVERE EXCESSIVE BREAST TISSUE  (Bilateral)  SURGEON:  Surgeon(s) and Role:    * Louisa Second, MD - Primary  PHYSICIAN ASSISTANT:   ASSISTANTS: none   ANESTHESIA:   general  EBL:  Total I/O In: 2000 [I.V.:2000] Out: -   BLOOD ADMINISTERED:none  DRAINS: none   LOCAL MEDICATIONS USED:  LIDOCAINE   SPECIMEN:  Excision  DISPOSITION OF SPECIMEN:  PATHOLOGY  COUNTS:  YES  TOURNIQUET:  * No tourniquets in log *  DICTATION: .Other Dictation: Dictation Number [id number]  PLAN OF CARE: Discharge to home after PACU  PATIENT DISPOSITION:  PACU - hemodynamically stable.   Delay start of Pharmacological VTE agent (>24hrs) due to surgical blood loss or risk of bleeding: yes

## 2013-07-06 NOTE — Op Note (Deleted)
NAME:  Rice, Rebecca               ACCOUNT NO.:  [id number]  MEDICAL RECORD NO.:  [id number]  LOCATION:                                FACILITY:  [facility]  PHYSICIAN:  Rebecca Rice, M.D.   DATE OF BIRTH:  DATE OF PROCEDURE:  07/05/2013 DATE OF DISCHARGE:  07/05/2013                              OPERATIVE REPORT   A 42-year-old lady who over a year ago had bilateral mastectomies for breast cancer, had immediate reconstruction with Dr. __________ and Dr. Sanger respectively.  Postop, developed severe scarring and eschar over the right breast and on pictures it looks as if it was very __________ with regard to healing.  This was allowed to heal.  The patient had tissue expanders put in.  At this time, she has developed severe scarring over the right and left anterior chest and breast areas as well as asymmetry of the tissues and the implants.  She also has severe accessory breast tissue left from the mastectomies and reconstructions. When the patient stands __________ it looks as if she has weans from these areas.  She also has a capsule formation around the right and left breast areas.  Procedures planned, right and left breast reconstructions, tissue expander removal and replacements, excision of large amounts of accessory breast tissue in the right and left axillary and latissimus dorsi areas with flap reconstruction, bilateral capsulectomies; implant exchanges with Mentor Style 1400, right and left tissue expanders on the right side, serial number is [id number], on the left the serial number is [id number].  Each one was filled up to 750 mL today.  Scar also done on the right and left anterior chest and breast areas for excision of severe scar deformity, unsightly scarring.  ANESTHESIA:  General.  DESCRIPTION OF PROCEDURE:  Preoperatively, the patient was sat up and drawn for all the areas addressed as above with a marking pen.  Midline as well as the right and left  inframammary folds were also __________.  She was taken to the operating room, placed on the operating room table in the supine position, was given adequate general anesthesia, was intubated orally.  Prep was done to the chest and breast areas in routine fashion using Hibiclens soap and solution walled off with sterile towels and drapes so as to make a sterile field.  Scar revision was made over the anterior breast areas with excision of some large amounts of severe dark and hypertrophic scarring.  The superior and inferior flaps were freed up.  The dissection was then carried down to the pectoralis major muscle.  Muscle was divided in direction of its fibers, underlying implant was removed.  She had a capsule formation in the inframammary fold areas as well as medially on both sides.  These were freed out with capsulotomies.  After proper hemostasis, the new tissue expanders were placed with good symmetry and then __________ up to 750 mL on the right side, which had less __________ across the tissue expanders now tighter and we hope that would help to elevate and expand that over a period of time to even out with the left side.  Next, attention was drawn to the muscle.  Muscle   repaired in layers with 2-0 Monocryl x2 layers, subcuticular stitch of 3-0 Monocryl was placed. Next, attention was drawn to the right and left axillary regions where severe amount of accessory breast tissue was excised in elliptical fashion, congruent with the inframammary fold incision.  We took out excess skin and fat.  After this, the flaps were then rotated and advanced and stayed with deep sutures of 2-0 Monocryl x2 layers, a subdermal suture of 5-0 Monocryl and then a running subcuticular stitch of 3-0 Monocryl were placed throughout the area.  The wounds were cleansed.  Steri-Strips and soft dressing applied in all areas.  I was happy with the symmetry at the end of the procedure, __________ the right side  is tighter now and would more time to expand.  ESTIMATED BLOOD LOSS:  150 mL.  COMPLICATIONS:  None.  The wounds were cleansed.  Half-inch Steri-Strips and soft dressing were applied in all areas and a breast garment.  She was then taken to recovery in stable condition.  No complications.     Rebecca Rice, M.D.     GLT/MEDQ  D:  07/05/2013  T:  07/06/2013  Job:  [id number] 

## 2013-07-06 NOTE — Op Note (Signed)
NAMETISH, BEGIN NO.:  [id number]  MEDICAL RECORD NO.:  [id number]  LOCATION:                                FACILITY:  [facility]  PHYSICIAN:  Earvin Hansen L. Shon Hough, M.D.   DATE OF BIRTH:  DATE OF PROCEDURE:  07/05/2013 DATE OF DISCHARGE:  07/05/2013                              OPERATIVE REPORT   A 42 year old lady who over a year ago had bilateral mastectomies for breast cancer, had immediate reconstruction with Dr. __________ and Dr. Kelly Splinter respectively.  Postop, developed severe scarring and eschar over the right breast and on pictures it looks as if it was very __________ with regard to healing.  This was allowed to heal.  The patient had tissue expanders put in.  At this time, she has developed severe scarring over the right and left anterior chest and breast areas as well as asymmetry of the tissues and the implants.  She also has severe accessory breast tissue left from the mastectomies and reconstructions. When the patient stands __________ it looks as if she has weans from these areas.  She also has a capsule formation around the right and left breast areas.  Procedures planned, right and left breast reconstructions, tissue expander removal and replacements, excision of large amounts of accessory breast tissue in the right and left axillary and latissimus dorsi areas with flap reconstruction, bilateral capsulectomies; implant exchanges with Mentor Style 1400, right and left tissue expanders on the right side, serial number is [id number], on the left the serial number is [id number].  Each one was filled up to 750 mL today.  Scar also done on the right and left anterior chest and breast areas for excision of severe scar deformity, unsightly scarring.  ANESTHESIA:  General.  DESCRIPTION OF PROCEDURE:  Preoperatively, the patient was sat up and drawn for all the areas addressed as above with a marking pen.  Midline as well as the right and left  inframammary folds were also __________.  She was taken to the operating room, placed on the operating room table in the supine position, was given adequate general anesthesia, was intubated orally.  Prep was done to the chest and breast areas in routine fashion using Hibiclens soap and solution walled off with sterile towels and drapes so as to make a sterile field.  Scar revision was made over the anterior breast areas with excision of some large amounts of severe dark and hypertrophic scarring.  The superior and inferior flaps were freed up.  The dissection was then carried down to the pectoralis major muscle.  Muscle was divided in direction of its fibers, underlying implant was removed.  She had a capsule formation in the inframammary fold areas as well as medially on both sides.  These were freed out with capsulotomies.  After proper hemostasis, the new tissue expanders were placed with good symmetry and then __________ up to 750 mL on the right side, which had less __________ across the tissue expanders now tighter and we hope that would help to elevate and expand that over a period of time to even out with the left side.  Next, attention was drawn to the muscle.  Muscle  repaired in layers with 2-0 Monocryl x2 layers, subcuticular stitch of 3-0 Monocryl was placed. Next, attention was drawn to the right and left axillary regions where severe amount of accessory breast tissue was excised in elliptical fashion, congruent with the inframammary fold incision.  We took out excess skin and fat.  After this, the flaps were then rotated and advanced and stayed with deep sutures of 2-0 Monocryl x2 layers, a subdermal suture of 5-0 Monocryl and then a running subcuticular stitch of 3-0 Monocryl were placed throughout the area.  The wounds were cleansed.  Steri-Strips and soft dressing applied in all areas.  I was happy with the symmetry at the end of the procedure, __________ the right side  is tighter now and would more time to expand.  ESTIMATED BLOOD LOSS:  150 mL.  COMPLICATIONS:  None.  The wounds were cleansed.  Half-inch Steri-Strips and soft dressing were applied in all areas and a breast garment.  She was then taken to recovery in stable condition.  No complications.     Yaakov Guthrie. Shon Hough, M.D.     Cathie Hoops  D:  07/05/2013  T:  07/06/2013  Job:  [id number]

## 2013-07-07 ENCOUNTER — Encounter (HOSPITAL_BASED_OUTPATIENT_CLINIC_OR_DEPARTMENT_OTHER): Payer: Self-pay | Admitting: Specialist

## 2013-07-13 ENCOUNTER — Emergency Department (HOSPITAL_COMMUNITY)
Admission: EM | Admit: 2013-07-13 | Discharge: 2013-07-13 | Disposition: A | Discharge status: Home / Self Care | Payer: BC Managed Care – PPO | Attending: Emergency Medicine | Admitting: Emergency Medicine

## 2013-07-13 ENCOUNTER — Encounter (HOSPITAL_COMMUNITY): Payer: Self-pay | Admitting: Emergency Medicine

## 2013-07-13 DIAGNOSIS — Z8719 Personal history of other diseases of the digestive system: Secondary | ICD-10-CM | POA: Insufficient documentation

## 2013-07-13 DIAGNOSIS — IMO0002 Reserved for concepts with insufficient information to code with codable children: Secondary | ICD-10-CM | POA: Insufficient documentation

## 2013-07-13 DIAGNOSIS — F319 Bipolar disorder, unspecified: Secondary | ICD-10-CM | POA: Insufficient documentation

## 2013-07-13 DIAGNOSIS — I10 Essential (primary) hypertension: Secondary | ICD-10-CM | POA: Insufficient documentation

## 2013-07-13 DIAGNOSIS — Z79899 Other long term (current) drug therapy: Secondary | ICD-10-CM | POA: Insufficient documentation

## 2013-07-13 DIAGNOSIS — Z8739 Personal history of other diseases of the musculoskeletal system and connective tissue: Secondary | ICD-10-CM | POA: Insufficient documentation

## 2013-07-13 DIAGNOSIS — J45909 Unspecified asthma, uncomplicated: Secondary | ICD-10-CM | POA: Insufficient documentation

## 2013-07-13 DIAGNOSIS — R011 Cardiac murmur, unspecified: Secondary | ICD-10-CM | POA: Insufficient documentation

## 2013-07-13 DIAGNOSIS — IMO0001 Reserved for inherently not codable concepts without codable children: Secondary | ICD-10-CM

## 2013-07-13 DIAGNOSIS — Z8673 Personal history of transient ischemic attack (TIA), and cerebral infarction without residual deficits: Secondary | ICD-10-CM | POA: Insufficient documentation

## 2013-07-13 DIAGNOSIS — Y838 Other surgical procedures as the cause of abnormal reaction of the patient, or of later complication, without mention of misadventure at the time of the procedure: Secondary | ICD-10-CM | POA: Insufficient documentation

## 2013-07-13 DIAGNOSIS — E119 Type 2 diabetes mellitus without complications: Secondary | ICD-10-CM | POA: Insufficient documentation

## 2013-07-13 DIAGNOSIS — Z853 Personal history of malignant neoplasm of breast: Secondary | ICD-10-CM | POA: Insufficient documentation

## 2013-07-13 NOTE — ED Notes (Signed)
Patient with no complaints at this time. Respirations even and unlabored. Skin warm/dry. Discharge instructions reviewed with patient at this time. Patient given opportunity to voice concerns/ask questions. Patient discharged at this time and left Emergency Department with steady gait.   

## 2013-07-13 NOTE — ED Notes (Signed)
Pt states that she [facility], had gotten up to go the restroom during the night and noticed that she was bleeding from incision site. Did have follow up appointment yesterday and everything was fine, denies any pain.

## 2013-07-13 NOTE — ED Provider Notes (Signed)
CSN: [id number]     Arrival date & time 07/13/13  1191 History   First MD Initiated Contact with Patient 07/13/13 [phone]     Chief Complaint  Patient presents with  . Drainage from Incision   (Consider location/radiation/quality/duration/timing/severity/associated sxs/prior Treatment) HPI 42 year old female who comes in today complaining of post operative bleeding. She had breast reconstruction done by Dr. Shon Hough on December 1. She had the dressing in place until yesterday. She saw Dr. Shon Hough yesterday and states that she was told everything was fine and at that time the dressing was removed. She awoke during the early morning hours and noted bleeding from the left incision area. She states that there is quite large amount of red to maroon blood noted. She states she had to keep a pad in place until after she arrived here because of the amount of bleeding. She denies lightheadedness, weakness, and bleeding diatheses. She spoke with Dr. Shon Hough after she arrived here and was told that she should be seen here. She has not noted any increased pain, redness of the incision, or other discharge from the area. Past Medical History  Diagnosis Date  . Cancer     breast  . Diabetes mellitus     NIDDM  . Asthma     daily and prn inhalers; triggered by environmental allergies  . Arthritis     knees, ankles, hips  . GERD (gastroesophageal reflux disease)     OTC as needed  . Bipolar disorder   . Heart murmur     states no known problems  . Hypertension     under control; has been on med. > 12 yrs.  [name] TIA (transient ischemic attack) age 20    TIA vs. Bell's palsy(was undetermined) - no residual deficits  . Wears glasses    Past Surgical History  Procedure Laterality Date  . Foot surgery  01/2010; 1998  . Lasik  2002  . Wisdom tooth extraction  age 82  . Breast reconstruction  10/17/2011    Procedure: BREAST RECONSTRUCTION;  Surgeon: Wayland Denis, DO;  Location: [facility];   Service: Plastics;  Laterality: Bilateral;  bilateral breast reconstruction with expander and flex hd placement  . Breast capsulectomy with implant exchange  06/24/2012    Procedure: BREAST CAPSULECTOMY WITH IMPLANT EXCHANGE;  Surgeon: Wayland Denis, DO;  Location: [facility];  Service: Plastics;  Laterality: Bilateral;  removal of bilateral breast expanders capsulectomies and placement of implants    . Mastectomy  11/21/2010    bilat. simple mastect.lt snbx  . Breast implant exchange Bilateral 07/05/2013    Procedure: REMOVAL OF BILATERAL IMPLANTS REPLACEMENT OF BILATERAL IMPLANTS BILATERAL  ;  Surgeon: Louisa Second, MD;  Location: [facility];  Service: Plastics;  Laterality: Bilateral;  . Scar revision Bilateral 07/05/2013    Procedure: SCAR REVISION BILATERAL EXCISION OF SEVERE EXCESSIVE BREAST TISSUE ;  Surgeon: Louisa Second, MD;  Location: [facility];  Service: Plastics;  Laterality: Bilateral;   No family history on file. History  Substance Use Topics  . Smoking status: Never Smoker   . Smokeless tobacco: Never Used  . Alcohol Use: Yes     Comment: rarely   OB History   Grav Para Term Preterm Abortions TAB SAB Ect Mult Living                 Review of Systems  All other systems reviewed and are negative.    Allergies  Decongestant formula;  Vicodin; and Adhesive  Home Medications   Current Outpatient Rx  Name  Route  Sig  Dispense  Refill  . albuterol (PROVENTIL HFA;VENTOLIN HFA) 108 (90 BASE) MCG/ACT inhaler   Inhalation   Inhale 2 puffs into the lungs every 6 (six) hours as needed.         . cetirizine (ZYRTEC) 10 MG tablet   Oral   Take 10 mg by mouth daily.         . cloNIDine-chlorthalidone (CLORPRES) 0.1-15 MG per tablet   Oral   Take 10 tablets by mouth 2 (two) times daily.          [name] glipiZIDE (GLUCOTROL) 5 MG tablet   Oral   Take 10 mg by mouth 2 (two) times daily before a meal.          .  losartan-hydrochlorothiazide (HYZAAR) 100-25 MG per tablet   Oral   Take 1 tablet by mouth daily. AM         . metFORMIN (GLUCOPHAGE) 500 MG tablet   Oral   Take 500 mg by mouth 2 (two) times daily with a meal.         . risperidone (RISPERDAL) 4 MG tablet   Oral   Take 4 mg by mouth every evening.           LMP 07/01/2013 Physical Exam  Nursing note and vitals reviewed. Constitutional: She is oriented to person, place, and time. She appears well-developed and well-nourished.  HENT:  Head: Normocephalic and atraumatic.  Right Ear: External ear normal.  Left Ear: External ear normal.  Nose: Nose normal.  Mouth/Throat: Oropharynx is clear and moist.  Eyes: Conjunctivae and EOM are normal. Pupils are equal, round, and reactive to light.  Neck: Normal range of motion. Neck supple.  Cardiovascular: Normal rate, regular rhythm, normal heart sounds and intact distal pulses.   Pulmonary/Chest: Breath sounds normal. No respiratory distress. She has no wheezes.  The patient with well healing incisions bilateral chest wall the are of the anterior aspect and under the breasts bilaterally. No active bleeding is seen. Bilaterally she has palpable masses in the left lower quadrant of the breasts consistent with history of spacers placed.  Abdominal: Soft. Bowel sounds are normal.  Neurological: She is alert and oriented to person, place, and time.  Skin: Skin is warm and dry.  Psychiatric: She has a normal mood and affect. Her behavior is normal. Judgment and thought content normal.    ED Course  Procedures (including critical care time) Labs Review Labs Reviewed - No data to display Imaging Review No results found.  EKG Interpretation   None       MDM  No diagnosis found. Patient with postop bleeding and presents today stating the bleeding has since resolved. There is no bleeding seen on exam and she has a normal postoperative exam. The patient is advised that she should  followup with Dr. Shon Hough today or tomorrow. She is advised of return precautions such as increasing discharge, pain, fever, or weakness. I did not see any evidence of seroma or hematoma.   Hilario Quarry, MD 07/13/13 [phone]

## 2014-02-17 ENCOUNTER — Encounter (HOSPITAL_BASED_OUTPATIENT_CLINIC_OR_DEPARTMENT_OTHER)
Admission: RE | Admit: 2014-02-17 | Discharge: 2014-02-17 | Disposition: A | Discharge status: Home / Self Care | Payer: Managed Care, Other (non HMO) | Source: Ambulatory Visit | Attending: Specialist | Admitting: Specialist

## 2014-02-17 ENCOUNTER — Encounter (HOSPITAL_BASED_OUTPATIENT_CLINIC_OR_DEPARTMENT_OTHER): Payer: Self-pay | Admitting: *Deleted

## 2014-02-17 LAB — BASIC METABOLIC PANEL
Anion gap: 16 — ABNORMAL HIGH (ref 5–15)
BUN: 12 mg/dL (ref 6–23)
CALCIUM: 9.6 mg/dL (ref 8.4–10.5)
CO2: 24 meq/L (ref 19–32)
CREATININE: 0.82 mg/dL (ref 0.50–1.10)
Chloride: 100 mEq/L (ref 96–112)
GFR calc Af Amer: >90 mL/min (ref >90)
GFR calc non Af Amer: 86 mL/min — ABNORMAL LOW (ref >90)
GLUCOSE: 131 mg/dL — AB (ref 70–99)
Potassium: 3.9 mEq/L (ref 3.7–5.3)
SODIUM: 140 meq/L (ref 137–147)

## 2014-02-17 NOTE — Progress Notes (Signed)
bmet done-ekg done 11/14-has been here a few times-

## 2014-02-21 ENCOUNTER — Encounter (HOSPITAL_BASED_OUTPATIENT_CLINIC_OR_DEPARTMENT_OTHER)
Admission: RE | Disposition: A | Discharge status: Home / Self Care | Payer: Self-pay | Source: Ambulatory Visit | Attending: Specialist

## 2014-02-21 ENCOUNTER — Encounter (HOSPITAL_BASED_OUTPATIENT_CLINIC_OR_DEPARTMENT_OTHER): Payer: Managed Care, Other (non HMO) | Admitting: Anesthesiology

## 2014-02-21 ENCOUNTER — Ambulatory Visit (HOSPITAL_BASED_OUTPATIENT_CLINIC_OR_DEPARTMENT_OTHER): Payer: Managed Care, Other (non HMO) | Admitting: Anesthesiology

## 2014-02-21 ENCOUNTER — Ambulatory Visit (HOSPITAL_BASED_OUTPATIENT_CLINIC_OR_DEPARTMENT_OTHER)
Admission: RE | Admit: 2014-02-21 | Discharge: 2014-02-21 | Disposition: A | Discharge status: Home / Self Care | Payer: Managed Care, Other (non HMO) | Source: Ambulatory Visit | Attending: Specialist | Admitting: Specialist

## 2014-02-21 ENCOUNTER — Encounter (HOSPITAL_BASED_OUTPATIENT_CLINIC_OR_DEPARTMENT_OTHER): Payer: Self-pay | Admitting: *Deleted

## 2014-02-21 DIAGNOSIS — E119 Type 2 diabetes mellitus without complications: Secondary | ICD-10-CM | POA: Insufficient documentation

## 2014-02-21 DIAGNOSIS — Z9010 Acquired absence of unspecified breast and nipple: Secondary | ICD-10-CM | POA: Insufficient documentation

## 2014-02-21 DIAGNOSIS — F319 Bipolar disorder, unspecified: Secondary | ICD-10-CM | POA: Insufficient documentation

## 2014-02-21 DIAGNOSIS — C50919 Malignant neoplasm of unspecified site of unspecified female breast: Secondary | ICD-10-CM | POA: Insufficient documentation

## 2014-02-21 DIAGNOSIS — J45909 Unspecified asthma, uncomplicated: Secondary | ICD-10-CM | POA: Insufficient documentation

## 2014-02-21 DIAGNOSIS — L905 Scar conditions and fibrosis of skin: Secondary | ICD-10-CM | POA: Insufficient documentation

## 2014-02-21 DIAGNOSIS — I10 Essential (primary) hypertension: Secondary | ICD-10-CM | POA: Insufficient documentation

## 2014-02-21 DIAGNOSIS — Z8673 Personal history of transient ischemic attack (TIA), and cerebral infarction without residual deficits: Secondary | ICD-10-CM | POA: Insufficient documentation

## 2014-02-21 DIAGNOSIS — Z79899 Other long term (current) drug therapy: Secondary | ICD-10-CM | POA: Insufficient documentation

## 2014-02-21 HISTORY — PX: MASTOPEXY: SHX5358

## 2014-02-21 LAB — GLUCOSE, CAPILLARY
GLUCOSE-CAPILLARY: 116 mg/dL — AB (ref 70–99)
GLUCOSE-CAPILLARY: 132 mg/dL — AB (ref 70–99)

## 2014-02-21 LAB — POCT HEMOGLOBIN-HEMACUE: Hemoglobin: 12.4 g/dL (ref 12.0–15.0)

## 2014-02-21 SURGERY — MASTOPEXY
Anesthesia: General | Laterality: Bilateral

## 2014-02-21 MED ORDER — MIDAZOLAM HCL 2 MG/2ML IJ SOLN: 1.0000 mg | INTRAMUSCULAR | Status: DC | PRN

## 2014-02-21 MED ORDER — FENTANYL CITRATE 0.05 MG/ML IJ SOLN
INTRAMUSCULAR | Status: DC | PRN
  Administered 2014-02-21: 100 ug via INTRAVENOUS
  Administered 2014-02-21: 25 ug via INTRAVENOUS

## 2014-02-21 MED ORDER — OXYCODONE HCL 5 MG PO TABS: 5.0000 mg | ORAL_TABLET | Freq: Once | ORAL | Status: DC | PRN

## 2014-02-21 MED ORDER — LIDOCAINE-EPINEPHRINE 0.5 %-1:200000 IJ SOLN
INTRAMUSCULAR | Status: DC | PRN
  Administered 2014-02-21: 15 mL

## 2014-02-21 MED ORDER — FENTANYL CITRATE 0.05 MG/ML IJ SOLN
INTRAMUSCULAR | Status: AC
  Filled 2014-02-21: qty 6

## 2014-02-21 MED ORDER — DEXAMETHASONE SODIUM PHOSPHATE 4 MG/ML IJ SOLN
INTRAMUSCULAR | Status: DC | PRN
  Administered 2014-02-21: 10 mg via INTRAVENOUS

## 2014-02-21 MED ORDER — LIDOCAINE HCL (CARDIAC) 20 MG/ML IV SOLN
INTRAVENOUS | Status: DC | PRN
  Administered 2014-02-21: 60 mg via INTRAVENOUS

## 2014-02-21 MED ORDER — TRIAMCINOLONE ACETONIDE 40 MG/ML IJ SUSP
INTRAMUSCULAR | Status: AC
  Filled 2014-02-21: qty 5

## 2014-02-21 MED ORDER — LIDOCAINE-EPINEPHRINE 0.5 %-1:200000 IJ SOLN
INTRAMUSCULAR | Status: AC
Start: 2014-02-21 — End: 2014-02-21
  Filled 2014-02-21: qty 3

## 2014-02-21 MED ORDER — CEFAZOLIN SODIUM-DEXTROSE 2-3 GM-% IV SOLR
INTRAVENOUS | Status: AC
  Filled 2014-02-21: qty 50

## 2014-02-21 MED ORDER — HYDROMORPHONE HCL PF 1 MG/ML IJ SOLN: 0.2500 mg | INTRAMUSCULAR | Status: DC | PRN

## 2014-02-21 MED ORDER — MIDAZOLAM HCL 2 MG/2ML IJ SOLN
INTRAMUSCULAR | Status: AC
  Filled 2014-02-21: qty 2

## 2014-02-21 MED ORDER — CEFAZOLIN SODIUM-DEXTROSE 2-3 GM-% IV SOLR
2.0000 g | INTRAVENOUS | Status: AC
  Administered 2014-02-21: 2 g via INTRAVENOUS

## 2014-02-21 MED ORDER — MIDAZOLAM HCL 5 MG/5ML IJ SOLN
INTRAMUSCULAR | Status: DC | PRN
  Administered 2014-02-21: 2 mg via INTRAVENOUS

## 2014-02-21 MED ORDER — OXYCODONE HCL 5 MG/5ML PO SOLN: 5.0000 mg | Freq: Once | ORAL | Status: DC | PRN

## 2014-02-21 MED ORDER — LACTATED RINGERS IV SOLN
INTRAVENOUS | Status: DC
  Administered 2014-02-21: 08:00:00 via INTRAVENOUS

## 2014-02-21 MED ORDER — FENTANYL CITRATE 0.05 MG/ML IJ SOLN: 50.0000 ug | INTRAMUSCULAR | Status: DC | PRN

## 2014-02-21 MED ORDER — METOCLOPRAMIDE HCL 5 MG/ML IJ SOLN: 10.0000 mg | Freq: Once | INTRAMUSCULAR | Status: DC | PRN

## 2014-02-21 MED ORDER — PROPOFOL 10 MG/ML IV BOLUS
INTRAVENOUS | Status: DC | PRN
  Administered 2014-02-21: 200 mg via INTRAVENOUS

## 2014-02-21 SURGICAL SUPPLY — 58 items
BAG DECANTER FOR FLEXI CONT (MISCELLANEOUS) IMPLANT
BENZOIN TINCTURE PRP APPL 2/3 (GAUZE/BANDAGES/DRESSINGS) ×4 IMPLANT
BINDER BREAST XLRG (GAUZE/BANDAGES/DRESSINGS) ×2 IMPLANT
BLADE HEX COATED 2.75 (ELECTRODE) IMPLANT
BLADE KNIFE  20 PERSONNA (BLADE) ×1
BLADE KNIFE 20 PERSONNA (BLADE) ×1 IMPLANT
BLADE KNIFE PERSONA 15 (BLADE) ×4 IMPLANT
CANISTER SUCT 1200ML W/VALVE (MISCELLANEOUS) ×2 IMPLANT
COTTONBALL LRG STERILE PKG (GAUZE/BANDAGES/DRESSINGS) IMPLANT
COVER MAYO STAND STRL (DRAPES) ×2 IMPLANT
COVER TABLE BACK 60X90 (DRAPES) ×2 IMPLANT
DECANTER SPIKE VIAL GLASS SM (MISCELLANEOUS) ×2 IMPLANT
DRAPE LAPAROSCOPIC ABDOMINAL (DRAPES) ×2 IMPLANT
DRAPE UTILITY XL STRL (DRAPES) ×2 IMPLANT
DRSG PAD ABDOMINAL 8X10 ST (GAUZE/BANDAGES/DRESSINGS) ×8 IMPLANT
DRSG TELFA 3X8 NADH (GAUZE/BANDAGES/DRESSINGS) ×2 IMPLANT
ELECT REM PT RETURN 9FT ADLT (ELECTROSURGICAL) ×2
ELECTRODE REM PT RTRN 9FT ADLT (ELECTROSURGICAL) ×1 IMPLANT
FILTER 7/8 IN (FILTER) IMPLANT
GAUZE SPONGE 4X4 12PLY STRL (GAUZE/BANDAGES/DRESSINGS) ×2 IMPLANT
GAUZE XEROFORM 1X8 LF (GAUZE/BANDAGES/DRESSINGS) ×2 IMPLANT
GAUZE XEROFORM 5X9 LF (GAUZE/BANDAGES/DRESSINGS) ×2 IMPLANT
GLOVE BIO SURGEON STRL SZ 6.5 (GLOVE) IMPLANT
GLOVE ECLIPSE 7.0 STRL STRAW (GLOVE) IMPLANT
GLOVE EXAM NITRILE MD LF STRL (GLOVE) ×2 IMPLANT
GLOVE SURG SS PI 7.5 STRL IVOR (GLOVE) ×6 IMPLANT
GOWN STRL REUS W/ TWL LRG LVL3 (GOWN DISPOSABLE) ×2 IMPLANT
GOWN STRL REUS W/ TWL XL LVL3 (GOWN DISPOSABLE) IMPLANT
GOWN STRL REUS W/TWL LRG LVL3 (GOWN DISPOSABLE) ×2
GOWN STRL REUS W/TWL XL LVL3 (GOWN DISPOSABLE)
IV NS 500ML (IV SOLUTION) ×2
IV NS 500ML BAXH (IV SOLUTION) ×2 IMPLANT
NEEDLE HYPO 22GX1.5 SAFETY (NEEDLE) ×2 IMPLANT
NEEDLE HYPO 25X1 1.5 SAFETY (NEEDLE) IMPLANT
NEEDLE SPNL 18GX3.5 QUINCKE PK (NEEDLE) IMPLANT
NS IRRIG 1000ML POUR BTL (IV SOLUTION) IMPLANT
PACK BASIN DAY SURGERY FS (CUSTOM PROCEDURE TRAY) ×2 IMPLANT
PEN SKIN MARKING BROAD TIP (MISCELLANEOUS) ×2 IMPLANT
SLEEVE SURGEON STRL (DRAPES) ×2 IMPLANT
SPONGE LAP 18X18 X RAY DECT (DISPOSABLE) ×4 IMPLANT
STRIP SUTURE WOUND CLOSURE 1/2 (SUTURE) ×2 IMPLANT
SUT ETHILON 5 0 PS 2 18 (SUTURE) IMPLANT
SUT MNCRL AB 3-0 PS2 18 (SUTURE) ×4 IMPLANT
SUT MON AB 2-0 CT1 36 (SUTURE) ×4 IMPLANT
SUT MON AB 5-0 PS2 18 (SUTURE) IMPLANT
SUT PROLENE 4 0 PS 2 18 (SUTURE) IMPLANT
SYRINGE 60CC LL (MISCELLANEOUS) ×2 IMPLANT
SYRINGE CONTROL L 12CC (SYRINGE) IMPLANT
TAPE HYPAFIX 6X30 (GAUZE/BANDAGES/DRESSINGS) ×2 IMPLANT
TAPE MEASURE 72IN RETRACT (INSTRUMENTS)
TAPE MEASURE LINEN 72IN RETRCT (INSTRUMENTS) IMPLANT
TOWEL OR 17X24 6PK STRL BLUE (TOWEL DISPOSABLE) ×8 IMPLANT
TOWEL OR NON WOVEN STRL DISP B (DISPOSABLE) ×2 IMPLANT
TRAY DSU PREP LF (CUSTOM PROCEDURE TRAY) ×2 IMPLANT
TUBE CONNECTING 20X1/4 (TUBING) ×2 IMPLANT
UNDERPAD 30X30 INCONTINENT (UNDERPADS AND DIAPERS) ×4 IMPLANT
VAC PENCILS W/TUBING CLEAR (MISCELLANEOUS) ×2 IMPLANT
YANKAUER SUCT BULB TIP NO VENT (SUCTIONS) ×2 IMPLANT

## 2014-02-21 NOTE — Brief Op Note (Signed)
02/21/2014  9:57 AM  PATIENT:  Rebecca Rice  43 y.o. 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, unspecified site Severe scar bilateral breast  PROCEDURE:  Procedure(s): SCAR REVISION RIGHT AND LEFT BREAST (Bilateral)  SURGEON:  Surgeon(s) and Role:    * Cristine Polio, MD - Primary  PHYSICIAN ASSISTANT:   ASSISTANTS: none   ANESTHESIA:   general  EBL:  Total I/O In: 1300 [I.V.:1300] Out: -   BLOOD ADMINISTERED:none  DRAINS: none   LOCAL MEDICATIONS USED:  LIDOCAINE   SPECIMEN:  Excision  DISPOSITION OF SPECIMEN:  PATHOLOGY  COUNTS:  YES  TOURNIQUET:  * No tourniquets in log *  DICTATION: .Other Dictation: Dictation Number [id number]  PLAN OF CARE: Discharge to home after PACU  PATIENT DISPOSITION:  PACU - hemodynamically stable.   Delay start of Pharmacological VTE agent (>24hrs) due to surgical blood loss or risk of bleeding: not applicable

## 2014-02-21 NOTE — Transfer of Care (Signed)
Immediate Anesthesia Transfer of Care Note  Patient: Rebecca Rice  Procedure(s) Performed: Procedure(s): SCAR REVISION RIGHT AND LEFT BREAST (Bilateral)  Patient Location: PACU  Anesthesia Type:General  Level of Consciousness: awake and sedated  Airway & Oxygen Therapy: Patient Spontanous Breathing and Patient connected to face mask oxygen  Post-op Assessment: Report given to PACU RN and Post -op Vital signs reviewed and stable  Post vital signs: Reviewed and stable  Complications: No apparent anesthesia complications

## 2014-02-21 NOTE — H&P (Signed)
Rebecca Rice is an 43 y.o. female.   Chief Complaint: Severe scar circatrix of the breasts HPI: SP reconstruction of the right and left breast areas and assymmetry done by another surgeon  I re did the breast tissue expanders several months ago and now is ready to have  Excision of severe scar circatrixes and plasic reconstructions bilaterally  Past Medical History  Diagnosis Date  . Cancer     breast  . Diabetes mellitus     NIDDM  . Asthma     daily and prn inhalers; triggered by environmental allergies  . Arthritis     knees, ankles, hips  . GERD (gastroesophageal reflux disease)     OTC as needed  . Bipolar disorder   . Heart murmur     states no known problems  . Hypertension     under control; has been on med. > 12 yrs.  [name] TIA (transient ischemic attack) age 68    TIA vs. Bell's palsy(was undetermined) - no residual deficits  . Wears glasses     Past Surgical History  Procedure Laterality Date  . Foot surgery  01/2010; 1998  . Lasik  2002  . Wisdom tooth extraction  age 40  . Breast reconstruction  10/17/2011    Procedure: BREAST RECONSTRUCTION;  Surgeon: Theodoro Kos, DO;  Location: [facility];  Service: Plastics;  Laterality: Bilateral;  bilateral breast reconstruction with expander and flex hd placement  . Breast capsulectomy with implant exchange  06/24/2012    Procedure: BREAST CAPSULECTOMY WITH IMPLANT EXCHANGE;  Surgeon: Theodoro Kos, DO;  Location: [facility];  Service: Plastics;  Laterality: Bilateral;  removal of bilateral breast expanders capsulectomies and placement of implants    . Mastectomy  11/21/2010    bilat. simple mastect.lt snbx  . Breast implant exchange Bilateral 07/05/2013    Procedure: REMOVAL OF BILATERAL IMPLANTS REPLACEMENT OF BILATERAL IMPLANTS BILATERAL  ;  Surgeon: Cristine Polio, MD;  Location: [facility];  Service: Plastics;  Laterality: Bilateral;  . Scar revision Bilateral 07/05/2013   Procedure: SCAR REVISION BILATERAL EXCISION OF SEVERE EXCESSIVE BREAST TISSUE ;  Surgeon: Cristine Polio, MD;  Location: [facility];  Service: Plastics;  Laterality: Bilateral;    History reviewed. No pertinent family history. Social History:  reports that she has never smoked. She has never used smokeless tobacco. She reports that she drinks alcohol. She reports that she does not use illicit drugs.  Allergies:  Allergies  Allergen Reactions  . Decongestant Formula Other (See Comments)    TACHYCARDIA  . Vicodin [Hydrocodone-Acetaminophen] Itching  . Adhesive [Tape] Rash    Medications Prior to Admission  Medication Sig Dispense Refill  . cetirizine (ZYRTEC) 10 MG tablet Take 10 mg by mouth daily.      . clonazePAM (KLONOPIN) 1 MG tablet Take 1 mg by mouth at bedtime.      . cloNIDine (CATAPRES) 0.1 MG tablet Take 0.1 mg by mouth daily.      [name] losartan-hydrochlorothiazide (HYZAAR) 100-25 MG per tablet Take 1 tablet by mouth daily. AM      . lurasidone (LATUDA) 40 MG TABS tablet Take 40 mg by mouth daily with breakfast.      . metFORMIN (GLUCOPHAGE) 500 MG tablet Take 500 mg by mouth 2 (two) times daily with a meal.      . mometasone (ASMANEX) 220 MCG/INH inhaler Inhale 2 puffs into the lungs 2 (two) times daily.      [name]  albuterol (PROVENTIL HFA;VENTOLIN HFA) 108 (90 BASE) MCG/ACT inhaler Inhale 2 puffs into the lungs every 6 (six) hours as needed.      . cloNIDine-chlorthalidone (CLORPRES) 0.1-15 MG per tablet Take 10 tablets by mouth 2 (two) times daily.         Results for orders placed during the hospital encounter of 02/21/14 (from the past 48 hour(s))  GLUCOSE, CAPILLARY     Status: Abnormal   Collection Time    02/21/14  8:04 AM      Result Value Ref Range   Glucose-Capillary 116 (*) 70 - 99 mg/dL  POCT HEMOGLOBIN-HEMACUE     Status: None   Collection Time    02/21/14  8:05 AM      Result Value Ref Range   Hemoglobin 12.4  12.0 - 15.0 g/dL   No results  found.  Review of Systems  Constitutional: Negative.   HENT: Negative.   Eyes: Negative.   Respiratory: Negative.   Cardiovascular: Negative.   Gastrointestinal: Negative.   Genitourinary: Negative.   Musculoskeletal: Negative.   Skin: Positive for rash.  Neurological: Negative.   Endo/Heme/Allergies: Negative.   Psychiatric/Behavioral: Positive for depression. The patient has insomnia.     Blood pressure 113/66, pulse 75, temperature 98.7 F (37.1 C), temperature source Oral, resp. rate 20, height 5\' 4"  (1.626 m), weight 88.905 kg (196 lb), last menstrual period 02/14/2014, SpO2 99.00%. Physical Exam   Assessment/Plan Severe scar circatrixes of bilateral breasts and for first stage excision and plastic reconstructions  Manmeet Arzola L 02/21/2014, 8:24 AM

## 2014-02-21 NOTE — Anesthesia Preprocedure Evaluation (Signed)
Anesthesia Evaluation  Patient identified by MRN, date of birth, ID band Patient awake    Reviewed: Allergy & Precautions, H&P , NPO status , Patient's Chart, lab work & pertinent test results, reviewed documented beta blocker date and time   Airway Mallampati: II TM Distance: >3 FB Neck ROM: full    Dental   Pulmonary asthma ,  breath sounds clear to auscultation        Cardiovascular hypertension, Pt. on medications + Valvular Problems/Murmurs Rhythm:regular     Neuro/Psych PSYCHIATRIC DISORDERS Bipolar Disorder TIA   GI/Hepatic negative GI ROS, Neg liver ROS,   Endo/Other  diabetes, Oral Hypoglycemic Agents  Renal/GU negative Renal ROS  negative genitourinary   Musculoskeletal   Abdominal   Peds  Hematology negative hematology ROS (+)   Anesthesia Other Findings See surgeon's H&P   Reproductive/Obstetrics negative OB ROS                           Anesthesia Physical Anesthesia Plan  ASA: III  Anesthesia Plan: General   Post-op Pain Management:    Induction: Intravenous  Airway Management Planned: Oral ETT  Additional Equipment:   Intra-op Plan:   Post-operative Plan:   Informed Consent: I have reviewed the patients History and Physical, chart, labs and discussed the procedure including the risks, benefits and alternatives for the proposed anesthesia with the patient or authorized representative who has indicated his/her understanding and acceptance.   Dental Advisory Given  Plan Discussed with: CRNA and Surgeon  Anesthesia Plan Comments:         Anesthesia Quick Evaluation

## 2014-02-21 NOTE — Discharge Instructions (Signed)
Activity (include date of return to work if known) As tolerated: NO showers NO driving No heavy activities  Diet:regular No restrictions:  Wound Care: Keep dressing clean & dry  Do not change dressings  Special Instructions: Do not raise arms up   Call Doctor if any unusual problems occur such as pain, excessive   bleeding, unrelieved Nausea/vomiting, Fever &/or chills  When lying down, keep head elevated on 2-3 pillows or back-rest    Follow-up appointment: Please call the office.  The patient received discharge instruction from:___________________________________________      Patient signature ________________________________________ / Date___________    Signature of individual providing instructions/ Date________________           Post Anesthesia Home Care Instructions  Activity: Get plenty of rest for the remainder of the day. A responsible adult should stay with you for 24 hours following the procedure.  For the next 24 hours, DO NOT: -Drive a car -Paediatric nurse -Drink alcoholic beverages -Take any medication unless instructed by your physician -Make any legal decisions or sign important papers.  Meals: Start with liquid foods such as gelatin or soup. Progress to regular foods as tolerated. Avoid greasy, spicy, heavy foods. If nausea and/or vomiting occur, drink only clear liquids until the nausea and/or vomiting subsides. Call your physician if vomiting continues.  Special Instructions/Symptoms: Your throat may feel dry or sore from the anesthesia or the breathing tube placed in your throat during surgery. If this causes discomfort, gargle with warm salt water. The discomfort should disappear within 24 hours.

## 2014-02-21 NOTE — Anesthesia Procedure Notes (Signed)
Procedure Name: LMA Insertion Performed by: Brigetta Beckstrom W Pre-anesthesia Checklist: Patient identified, Timeout performed, Emergency Drugs available, Suction available and Patient being monitored Patient Re-evaluated:Patient Re-evaluated prior to inductionOxygen Delivery Method: Circle system utilized Preoxygenation: Pre-oxygenation with 100% oxygen Intubation Type: IV induction Ventilation: Mask ventilation without difficulty LMA: LMA inserted LMA Size: 4.0 Number of attempts: 1 Placement Confirmation: breath sounds checked- equal and bilateral and positive ETCO2 Tube secured with: Tape Dental Injury: Teeth and Oropharynx as per pre-operative assessment      

## 2014-02-21 NOTE — Anesthesia Postprocedure Evaluation (Signed)
Anesthesia Post Note  Patient: Rebecca Rice  Procedure(s) Performed: Procedure(s) (LRB): SCAR REVISION RIGHT AND LEFT BREAST (Bilateral)  Anesthesia type: General  Patient location: PACU  Post pain: Pain level controlled  Post assessment: Patient's Cardiovascular Status Stable  Last Vitals:  Filed Vitals:   02/21/14 1030  BP: 102/57  Pulse: 82  Temp:   Resp: 19    Post vital signs: Reviewed and stable  Level of consciousness: alert  Complications: No apparent anesthesia complications

## 2014-02-22 ENCOUNTER — Encounter (HOSPITAL_BASED_OUTPATIENT_CLINIC_OR_DEPARTMENT_OTHER): Payer: Self-pay | Admitting: Specialist

## 2014-02-22 NOTE — Op Note (Signed)
[name], [name] NO.:  [id number]  MEDICAL RECORD NO.:  [id number]  LOCATION:                                 FACILITY:  PHYSICIAN:  Berneta Sages L. Towanda Malkin, M.D.DATE OF BIRTH:  [birth year]  DATE OF PROCEDURE:  02/21/2014 DATE OF DISCHARGE:  02/21/2014                              OPERATIVE REPORT   A 43 year old lady who was done by another plastic surgeon reconstruction of right and left breast areas, Dr. Migdalia Dk, several months ago.  The patient had asymmetry postoperatively with the implants and had a lot of trouble healing according to the patient and after reviewing pictures based on the right side where she had total __________ and has healed reasonably well on that side, but with severe severe scarring as well as on the left side.  PROCEDURE PLANNED:  First-stage excision of scar, right breast and left breast areas.  ANESTHESIA:  General.  Preoperatively, the patient was sat up and drawn for the areas of excision, 2 large areas on each side.  She underwent general anesthesia, intubated orally.  Prep was done to the chest/breast areas in routine fashion using Hibiclens soap and solution walled off with sterile towels and drapes so as to make a sterile field.  A 0.5% Xylocaine with epinephrine was injected locally, a total of 50 mL.  This was allowed to set and then using a #15 blade, I was able to excise all of the 2 large scar cicatrix on each side down to underlying pectoralis major muscle. The superior and inferior flaps were freed up significantly to allow flap rotation, advancement closure without tension and secured with deep sutures of 2-0 Monocryl x2 layers, a subdermal suture of 3-0 Monocryl, then a running subcuticular stitch of 3-0 Monocryl.  All procedures and repairs were done the same manner x4.  Hemostasis was maintained with Bovie unit on coagulation throughout the procedure.  Steri-Strips and soft dressing were applied all the areas  including Xeroform, 4x4s, ABDs, Hypafix tape, and a breast garment.  She was then taken to recovery in excellent condition.  Vital signs stable.     Rebecca Rice. Towanda Malkin, M.D.     Elie Confer  D:  02/21/2014  T:  02/21/2014  Job:  [id number]

## 2014-04-14 ENCOUNTER — Encounter (HOSPITAL_BASED_OUTPATIENT_CLINIC_OR_DEPARTMENT_OTHER): Payer: Self-pay | Admitting: *Deleted

## 2014-04-14 NOTE — Progress Notes (Signed)
Will come in for bmet

## 2014-04-15 ENCOUNTER — Encounter (HOSPITAL_BASED_OUTPATIENT_CLINIC_OR_DEPARTMENT_OTHER)
Admission: RE | Admit: 2014-04-15 | Discharge: 2014-04-15 | Disposition: A | Discharge status: Home / Self Care | Payer: Managed Care, Other (non HMO) | Source: Ambulatory Visit | Attending: Specialist | Admitting: Specialist

## 2014-04-15 DIAGNOSIS — Z79899 Other long term (current) drug therapy: Secondary | ICD-10-CM | POA: Diagnosis not present

## 2014-04-15 DIAGNOSIS — Z91040 Latex allergy status: Secondary | ICD-10-CM | POA: Diagnosis not present

## 2014-04-15 DIAGNOSIS — Z9010 Acquired absence of unspecified breast and nipple: Secondary | ICD-10-CM | POA: Diagnosis not present

## 2014-04-15 DIAGNOSIS — L905 Scar conditions and fibrosis of skin: Secondary | ICD-10-CM | POA: Diagnosis not present

## 2014-04-15 DIAGNOSIS — Z853 Personal history of malignant neoplasm of breast: Secondary | ICD-10-CM | POA: Diagnosis not present

## 2014-04-15 DIAGNOSIS — Z885 Allergy status to narcotic agent status: Secondary | ICD-10-CM | POA: Diagnosis not present

## 2014-04-15 LAB — BASIC METABOLIC PANEL
Anion gap: 12 (ref 5–15)
BUN: 17 mg/dL (ref 6–23)
CHLORIDE: 106 meq/L (ref 96–112)
CO2: 22 meq/L (ref 19–32)
Calcium: 8.7 mg/dL (ref 8.4–10.5)
Creatinine, Ser: 0.83 mg/dL (ref 0.50–1.10)
GFR calc Af Amer: >90 mL/min (ref >90)
GFR calc non Af Amer: 85 mL/min — ABNORMAL LOW (ref >90)
Glucose, Bld: 93 mg/dL (ref 70–99)
Potassium: 4.1 mEq/L (ref 3.7–5.3)
Sodium: 140 mEq/L (ref 137–147)

## 2014-04-18 ENCOUNTER — Encounter (HOSPITAL_BASED_OUTPATIENT_CLINIC_OR_DEPARTMENT_OTHER): Payer: Managed Care, Other (non HMO) | Admitting: Anesthesiology

## 2014-04-18 ENCOUNTER — Encounter (HOSPITAL_BASED_OUTPATIENT_CLINIC_OR_DEPARTMENT_OTHER): Payer: Self-pay

## 2014-04-18 ENCOUNTER — Encounter (HOSPITAL_BASED_OUTPATIENT_CLINIC_OR_DEPARTMENT_OTHER)
Admission: RE | Disposition: A | Discharge status: Home / Self Care | Payer: Self-pay | Source: Ambulatory Visit | Attending: Specialist

## 2014-04-18 ENCOUNTER — Ambulatory Visit (HOSPITAL_BASED_OUTPATIENT_CLINIC_OR_DEPARTMENT_OTHER): Payer: Managed Care, Other (non HMO) | Admitting: Anesthesiology

## 2014-04-18 ENCOUNTER — Ambulatory Visit (HOSPITAL_BASED_OUTPATIENT_CLINIC_OR_DEPARTMENT_OTHER)
Admission: RE | Admit: 2014-04-18 | Discharge: 2014-04-18 | Disposition: A | Discharge status: Home / Self Care | Payer: Managed Care, Other (non HMO) | Source: Ambulatory Visit | Attending: Specialist | Admitting: Specialist

## 2014-04-18 DIAGNOSIS — Z79899 Other long term (current) drug therapy: Secondary | ICD-10-CM | POA: Insufficient documentation

## 2014-04-18 DIAGNOSIS — Z885 Allergy status to narcotic agent status: Secondary | ICD-10-CM | POA: Insufficient documentation

## 2014-04-18 DIAGNOSIS — L905 Scar conditions and fibrosis of skin: Secondary | ICD-10-CM | POA: Insufficient documentation

## 2014-04-18 DIAGNOSIS — Z853 Personal history of malignant neoplasm of breast: Secondary | ICD-10-CM | POA: Diagnosis not present

## 2014-04-18 DIAGNOSIS — Z91040 Latex allergy status: Secondary | ICD-10-CM | POA: Insufficient documentation

## 2014-04-18 DIAGNOSIS — Z9010 Acquired absence of unspecified breast and nipple: Secondary | ICD-10-CM | POA: Insufficient documentation

## 2014-04-18 HISTORY — PX: BREAST RECONSTRUCTION: SHX9

## 2014-04-18 LAB — GLUCOSE, CAPILLARY
GLUCOSE-CAPILLARY: 133 mg/dL — AB (ref 70–99)
Glucose-Capillary: 97 mg/dL (ref 70–99)

## 2014-04-18 LAB — POCT HEMOGLOBIN-HEMACUE: Hemoglobin: 13.1 g/dL (ref 12.0–15.0)

## 2014-04-18 SURGERY — RECONSTRUCTION, NIPPLE
Anesthesia: General | Site: Breast

## 2014-04-18 MED ORDER — CEFAZOLIN SODIUM-DEXTROSE 2-3 GM-% IV SOLR
2.0000 g | INTRAVENOUS | Status: AC
  Administered 2014-04-18: 2 g via INTRAVENOUS

## 2014-04-18 MED ORDER — ONDANSETRON HCL 4 MG/2ML IJ SOLN
INTRAMUSCULAR | Status: DC | PRN
  Administered 2014-04-18: 4 mg via INTRAVENOUS

## 2014-04-18 MED ORDER — PROPOFOL 10 MG/ML IV BOLUS
INTRAVENOUS | Status: DC | PRN
  Administered 2014-04-18: 130 mg via INTRAVENOUS

## 2014-04-18 MED ORDER — FENTANYL CITRATE 0.05 MG/ML IJ SOLN: 50.0000 ug | INTRAMUSCULAR | Status: DC | PRN

## 2014-04-18 MED ORDER — MIDAZOLAM HCL 2 MG/2ML IJ SOLN
INTRAMUSCULAR | Status: AC
Start: 2014-04-18 — End: 2014-04-18
  Filled 2014-04-18: qty 2

## 2014-04-18 MED ORDER — LIDOCAINE-EPINEPHRINE (PF) 1 %-1:200000 IJ SOLN
INTRAMUSCULAR | Status: AC
  Filled 2014-04-18: qty 10

## 2014-04-18 MED ORDER — LACTATED RINGERS IV SOLN
INTRAVENOUS | Status: DC
  Administered 2014-04-18 (×2): via INTRAVENOUS

## 2014-04-18 MED ORDER — FENTANYL CITRATE 0.05 MG/ML IJ SOLN
INTRAMUSCULAR | Status: AC
  Filled 2014-04-18: qty 6

## 2014-04-18 MED ORDER — TRIAMCINOLONE ACETONIDE 40 MG/ML IJ SUSP
INTRAMUSCULAR | Status: AC
  Filled 2014-04-18: qty 5

## 2014-04-18 MED ORDER — MIDAZOLAM HCL 2 MG/2ML IJ SOLN: 1.0000 mg | INTRAMUSCULAR | Status: DC | PRN

## 2014-04-18 MED ORDER — SUCCINYLCHOLINE CHLORIDE 20 MG/ML IJ SOLN
INTRAMUSCULAR | Status: DC | PRN
  Administered 2014-04-18: 100 mg via INTRAVENOUS

## 2014-04-18 MED ORDER — CEFAZOLIN SODIUM-DEXTROSE 2-3 GM-% IV SOLR
INTRAVENOUS | Status: AC
  Filled 2014-04-18: qty 50

## 2014-04-18 MED ORDER — SODIUM CHLORIDE 0.9 % IV SOLN
INTRAVENOUS | Status: DC | PRN
  Administered 2014-04-18: 75 mL via INTRAMUSCULAR

## 2014-04-18 MED ORDER — DEXAMETHASONE SODIUM PHOSPHATE 4 MG/ML IJ SOLN
INTRAMUSCULAR | Status: DC | PRN
  Administered 2014-04-18: 8 mg via INTRAVENOUS

## 2014-04-18 MED ORDER — FENTANYL CITRATE 0.05 MG/ML IJ SOLN
INTRAMUSCULAR | Status: DC | PRN
  Administered 2014-04-18: 100 ug via INTRAVENOUS
  Administered 2014-04-18: 25 ug via INTRAVENOUS

## 2014-04-18 MED ORDER — SCOPOLAMINE 1 MG/3DAYS TD PT72
MEDICATED_PATCH | TRANSDERMAL | Status: AC
  Filled 2014-04-18: qty 1

## 2014-04-18 MED ORDER — LIDOCAINE HCL (CARDIAC) 20 MG/ML IV SOLN
INTRAVENOUS | Status: DC | PRN
  Administered 2014-04-18: 50 mg via INTRAVENOUS

## 2014-04-18 MED ORDER — MINERAL OIL LIGHT 100 % EX OIL
TOPICAL_OIL | CUTANEOUS | Status: AC
  Filled 2014-04-18: qty 25

## 2014-04-18 MED ORDER — SCOPOLAMINE 1 MG/3DAYS TD PT72
1.0000 | MEDICATED_PATCH | Freq: Once | TRANSDERMAL | Status: DC
  Administered 2014-04-18: 1.5 mg via TRANSDERMAL

## 2014-04-18 MED ORDER — EPHEDRINE SULFATE 50 MG/ML IJ SOLN
INTRAMUSCULAR | Status: DC | PRN
  Administered 2014-04-18 (×2): 10 mg via INTRAVENOUS

## 2014-04-18 MED ORDER — LIDOCAINE-EPINEPHRINE 0.5 %-1:200000 IJ SOLN
INTRAMUSCULAR | Status: AC
  Filled 2014-04-18: qty 2

## 2014-04-18 MED ORDER — LIDOCAINE-EPINEPHRINE 0.5 %-1:200000 IJ SOLN
INTRAMUSCULAR | Status: DC | PRN
  Administered 2014-04-18: 90 mL

## 2014-04-18 MED ORDER — MIDAZOLAM HCL 5 MG/5ML IJ SOLN
INTRAMUSCULAR | Status: DC | PRN
  Administered 2014-04-18: 2 mg via INTRAVENOUS

## 2014-04-18 SURGICAL SUPPLY — 50 items
BENZOIN TINCTURE PRP APPL 2/3 (GAUZE/BANDAGES/DRESSINGS) ×4 IMPLANT
BINDER BREAST XLRG (GAUZE/BANDAGES/DRESSINGS) ×4 IMPLANT
BLADE KNIFE PERSONA 10 (BLADE) IMPLANT
BLADE KNIFE PERSONA 15 (BLADE) ×4 IMPLANT
CANISTER SUCT 1200ML W/VALVE (MISCELLANEOUS) ×2 IMPLANT
COTTONBALL LRG STERILE PKG (GAUZE/BANDAGES/DRESSINGS) ×4 IMPLANT
COVER MAYO STAND STRL (DRAPES) ×2 IMPLANT
COVER TABLE BACK 60X90 (DRAPES) ×2 IMPLANT
DECANTER SPIKE VIAL GLASS SM (MISCELLANEOUS) ×2 IMPLANT
DRAPE LAPAROSCOPIC ABDOMINAL (DRAPES) ×2 IMPLANT
DRSG PAD ABDOMINAL 8X10 ST (GAUZE/BANDAGES/DRESSINGS) ×12 IMPLANT
ELECT BLADE 4.0 EZ CLEAN MEGAD (MISCELLANEOUS) ×2
ELECT REM PT RETURN 9FT ADLT (ELECTROSURGICAL) ×2
ELECTRODE BLDE 4.0 EZ CLN MEGD (MISCELLANEOUS) ×1 IMPLANT
ELECTRODE REM PT RTRN 9FT ADLT (ELECTROSURGICAL) ×1 IMPLANT
FILTER 7/8 IN (FILTER) ×2 IMPLANT
GAUZE XEROFORM 1X8 LF (GAUZE/BANDAGES/DRESSINGS) IMPLANT
GAUZE XEROFORM 5X9 LF (GAUZE/BANDAGES/DRESSINGS) ×4 IMPLANT
GLOVE ECLIPSE 7.0 STRL STRAW (GLOVE) IMPLANT
GLOVE EXAM NITRILE MD LF STRL (GLOVE) ×2 IMPLANT
GLOVE SURG SS PI 7.5 STRL IVOR (GLOVE) ×2 IMPLANT
GLOVE SURG SS PI 8.0 STRL IVOR (GLOVE) ×2 IMPLANT
GOWN STRL REUS W/ TWL LRG LVL3 (GOWN DISPOSABLE) IMPLANT
GOWN STRL REUS W/ TWL XL LVL3 (GOWN DISPOSABLE) ×2 IMPLANT
GOWN STRL REUS W/TWL LRG LVL3 (GOWN DISPOSABLE)
GOWN STRL REUS W/TWL XL LVL3 (GOWN DISPOSABLE) ×2
NDL SAFETY ECLIPSE 18X1.5 (NEEDLE) ×1 IMPLANT
NEEDLE HYPO 18GX1.5 SHARP (NEEDLE) ×1
NEEDLE HYPO 25X1 1.5 SAFETY (NEEDLE) ×2 IMPLANT
NEEDLE SPNL 18GX3.5 QUINCKE PK (NEEDLE) ×2 IMPLANT
NS IRRIG 1000ML POUR BTL (IV SOLUTION) IMPLANT
PACK BASIN DAY SURGERY FS (CUSTOM PROCEDURE TRAY) ×2 IMPLANT
PEN SKIN MARKING BROAD TIP (MISCELLANEOUS) ×2 IMPLANT
SLEEVE SCD COMPRESS KNEE MED (MISCELLANEOUS) ×2 IMPLANT
SPONGE LAP 18X18 X RAY DECT (DISPOSABLE) ×2 IMPLANT
STRIP SUTURE WOUND CLOSURE 1/2 (SUTURE) ×2 IMPLANT
SUT ETHILON 5 0 PS 2 18 (SUTURE) ×4 IMPLANT
SUT MNCRL AB 3-0 PS2 18 (SUTURE) ×8 IMPLANT
SUT MON AB 2-0 CT1 36 (SUTURE) IMPLANT
SUT PROLENE 4 0 PS 2 18 (SUTURE) ×8 IMPLANT
SYR 50ML LL SCALE MARK (SYRINGE) IMPLANT
SYR CONTROL 10ML LL (SYRINGE) ×2 IMPLANT
TAPE HYPAFIX 6X30 (GAUZE/BANDAGES/DRESSINGS) ×2 IMPLANT
TAPE MEASURE 72IN RETRACT (INSTRUMENTS)
TAPE MEASURE LINEN 72IN RETRCT (INSTRUMENTS) IMPLANT
TOWEL OR 17X24 6PK STRL BLUE (TOWEL DISPOSABLE) ×8 IMPLANT
TUBE CONNECTING 20X1/4 (TUBING) ×2 IMPLANT
UNDERPAD 30X30 INCONTINENT (UNDERPADS AND DIAPERS) ×4 IMPLANT
VAC PENCILS W/TUBING CLEAR (MISCELLANEOUS) ×2 IMPLANT
YANKAUER SUCT BULB TIP NO VENT (SUCTIONS) ×2 IMPLANT

## 2014-04-18 NOTE — H&P (Signed)
Rebecca [name] is an 43 y.o. female.   Chief Complaint: Bilateral mastectomies for breast cancer HPI: Previous patient of Dr Hazle Nordmann underwent bilateral mastectomies for breast cancer  Now after redoing is ready to undergo bilateral nipplle areolar reconstructions with full thickness skin grafts and skate flaps  The right lower inframammary folds will need to be lowered to match the left  Past Medical History  Diagnosis Date  . Cancer     breast  . Diabetes mellitus     NIDDM  . Asthma     daily and prn inhalers; triggered by environmental allergies  . Arthritis     knees, ankles, hips  . GERD (gastroesophageal reflux disease)     OTC as needed  . Bipolar disorder   . Heart murmur     states no known problems  . Hypertension     under control; has been on med. > 12 yrs.  [name] TIA (transient ischemic attack) age 76    TIA vs. Bell's palsy(was undetermined) - no residual deficits  . Wears glasses     Past Surgical History  Procedure Laterality Date  . Foot surgery  01/2010; 1998  . Lasik  2002  . Wisdom tooth extraction  age 35  . Breast reconstruction  10/17/2011    Procedure: BREAST RECONSTRUCTION;  Surgeon: Theodoro Kos, DO;  Location: [facility];  Service: Plastics;  Laterality: Bilateral;  bilateral breast reconstruction with expander and flex hd placement  . Breast capsulectomy with implant exchange  06/24/2012    Procedure: BREAST CAPSULECTOMY WITH IMPLANT EXCHANGE;  Surgeon: Theodoro Kos, DO;  Location: [facility];  Service: Plastics;  Laterality: Bilateral;  removal of bilateral breast expanders capsulectomies and placement of implants    . Mastectomy  11/21/2010    bilat. simple mastect.lt snbx  . Breast implant exchange Bilateral 07/05/2013    Procedure: REMOVAL OF BILATERAL IMPLANTS REPLACEMENT OF BILATERAL IMPLANTS BILATERAL  ;  Surgeon: Cristine Polio, MD;  Location: [facility];  Service: Plastics;  Laterality: Bilateral;   . Scar revision Bilateral 07/05/2013    Procedure: SCAR REVISION BILATERAL EXCISION OF SEVERE EXCESSIVE BREAST TISSUE ;  Surgeon: Cristine Polio, MD;  Location: [facility];  Service: Plastics;  Laterality: Bilateral;  . Mastopexy Bilateral 02/21/2014    Procedure: SCAR REVISION RIGHT AND LEFT BREAST;  Surgeon: Cristine Polio, MD;  Location: [facility];  Service: Plastics;  Laterality: Bilateral;    History reviewed. No pertinent family history. Social History:  reports that she has never smoked. She has never used smokeless tobacco. She reports that she drinks alcohol. She reports that she does not use illicit drugs.  Allergies:  Allergies  Allergen Reactions  . Decongestant Formula Other (See Comments)    TACHYCARDIA  . Latex Rash  . Vicodin [Hydrocodone-Acetaminophen] Itching  . Adhesive [Tape] Rash    Medications Prior to Admission  Medication Sig Dispense Refill  . albuterol (PROVENTIL HFA;VENTOLIN HFA) 108 (90 BASE) MCG/ACT inhaler Inhale 2 puffs into the lungs every 6 (six) hours as needed.      . cetirizine (ZYRTEC) 10 MG tablet Take 10 mg by mouth daily.      . clonazePAM (KLONOPIN) 1 MG tablet Take 1 mg by mouth at bedtime.      . cloNIDine (CATAPRES) 0.1 MG tablet Take 0.1 mg by mouth daily.      [name] losartan-hydrochlorothiazide (HYZAAR) 100-25 MG per tablet Take 1 tablet by mouth daily. AM      .  lurasidone (LATUDA) 40 MG TABS tablet Take 40 mg by mouth daily with breakfast.      . metFORMIN (GLUCOPHAGE) 500 MG tablet Take 500 mg by mouth 2 (two) times daily with a meal.      . mometasone (ASMANEX) 220 MCG/INH inhaler Inhale 2 puffs into the lungs 2 (two) times daily.      . cloNIDine-chlorthalidone (CLORPRES) 0.1-15 MG per tablet Take 10 tablets by mouth 2 (two) times daily.         Results for orders placed during the hospital encounter of 04/18/14 (from the past 48 hour(s))  POCT HEMOGLOBIN-HEMACUE     Status: None   Collection Time     04/18/14  7:56 AM      Result Value Ref Range   Hemoglobin 13.1  12.0 - 15.0 g/dL  GLUCOSE, CAPILLARY     Status: None   Collection Time    04/18/14  8:36 AM      Result Value Ref Range   Glucose-Capillary 97  70 - 99 mg/dL   No results found.  Review of Systems  Constitutional: Negative.   HENT: Negative.   Eyes: Negative.   Respiratory: Negative.   Cardiovascular: Negative.   Gastrointestinal: Negative.   Genitourinary: Negative.   Musculoskeletal: Negative.   Skin: Negative.   Neurological: Negative.   Endo/Heme/Allergies: Negative.   Psychiatric/Behavioral: Positive for depression.    Blood pressure 112/61, pulse 65, temperature 98.4 F (36.9 C), temperature source Oral, resp. rate 18, height 5\' 4"  (1.626 m), weight 88.905 kg (196 lb), last menstrual period 04/07/2014, SpO2 99.00%. Physical Exam   Assessment/Plan Bilateral mastectomies for breast cancer with reconstructions  For revision of the right reconstruction and reconstructions of right and left nipple areolar reconstructions  [name] L 04/18/2014, 8:39 AM

## 2014-04-18 NOTE — Transfer of Care (Signed)
Immediate Anesthesia Transfer of Care Note  Patient: Rebecca [name]  Procedure(s) Performed: Procedure(s): LOWER OF RIGHT INFRA MAMMARY FOLD/RIGHT AND LEFT NIPPLE AREOLA COMPLEX RECONSTRUCTION Additional Normal saline added to right breast implant (N/A)  Patient Location: PACU  Anesthesia Type:General  Level of Consciousness: sedated  Airway & Oxygen Therapy: Patient Spontanous Breathing and Patient connected to face mask oxygen  Post-op Assessment: Report given to PACU RN and Post -op Vital signs reviewed and stable  Post vital signs: Reviewed and stable  Complications: No apparent anesthesia complications

## 2014-04-18 NOTE — Anesthesia Postprocedure Evaluation (Signed)
  Anesthesia Post-op Note  Patient: Rebecca [name]  Procedure(s) Performed: Procedure(s): LOWER OF RIGHT INFRA MAMMARY FOLD/RIGHT AND LEFT NIPPLE AREOLA COMPLEX RECONSTRUCTION Additional Normal saline added to right breast implant (N/A)  Patient Location: PACU  Anesthesia Type: General   Level of Consciousness: awake, alert  and oriented  Airway and Oxygen Therapy: Patient Spontanous Breathing  Post-op Pain: none  Post-op Assessment: Post-op Vital signs reviewed  Post-op Vital Signs: Reviewed  Last Vitals:  Filed Vitals:   04/18/14 1215  BP: 118/81  Pulse: 89  Temp:   Resp: 24    Complications: No apparent anesthesia complications

## 2014-04-18 NOTE — Anesthesia Preprocedure Evaluation (Signed)
Anesthesia Evaluation  Patient identified by MRN, date of birth, ID band Patient awake    Reviewed: Allergy & Precautions, H&P , NPO status , Patient's Chart, lab work & pertinent test results  Airway Mallampati: I TM Distance: >3 FB Neck ROM: Full    Dental no notable dental hx. (+) Teeth Intact, Dental Advisory Given   Pulmonary asthma ,  breath sounds clear to auscultation  Pulmonary exam normal       Cardiovascular hypertension, On Medications Rhythm:Regular Rate:Normal     Neuro/Psych Bipolar Disorder TIAnegative neurological ROS  negative psych ROS   GI/Hepatic Neg liver ROS, GERD-  Controlled,  Endo/Other  diabetes, Type 2, Oral Hypoglycemic Agents  Renal/GU negative Renal ROS  negative genitourinary   Musculoskeletal   Abdominal   Peds  Hematology negative hematology ROS (+)   Anesthesia Other Findings   Reproductive/Obstetrics negative OB ROS                           Anesthesia Physical Anesthesia Plan  ASA: III  Anesthesia Plan: General   Post-op Pain Management:    Induction: Intravenous  Airway Management Planned: Oral ETT  Additional Equipment:   Intra-op Plan:   Post-operative Plan: Extubation in OR  Informed Consent: I have reviewed the patients History and Physical, chart, labs and discussed the procedure including the risks, benefits and alternatives for the proposed anesthesia with the patient or authorized representative who has indicated his/her understanding and acceptance.   Dental advisory given  Plan Discussed with: CRNA  Anesthesia Plan Comments:         Anesthesia Quick Evaluation

## 2014-04-18 NOTE — Brief Op Note (Signed)
04/18/2014  11:20 AM  PATIENT:  Rebecca [name]  43 y.o. 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)  PROCEDURE:  Procedure(s): LOWER OF RIGHT INFRA MAMMARY FOLD/RIGHT AND LEFT NIPPLE AREOLA COMPLEX RECONSTRUCTION  (N/A)  SURGEON:  Surgeon(s) and Role:    * Cristine Polio, MD - Primary  PHYSICIAN ASSISTANT:   ASSISTANTS: none   ANESTHESIA:   general  EBL:  Total I/O In: 2000 [I.V.:2000] Out: -   BLOOD ADMINISTERED:none  DRAINS: none   LOCAL MEDICATIONS USED:  LIDOCAINE   SPECIMEN:  Excision  DISPOSITION OF SPECIMEN:  PATHOLOGY  COUNTS:  YES  TOURNIQUET:  * No tourniquets in log *  DICTATION: .Other Dictation: Dictation Number [id number]  PLAN OF CARE: Discharge to home after PACU  PATIENT DISPOSITION:  PACU - hemodynamically stable.   Delay start of Pharmacological VTE agent (>24hrs) due to surgical blood loss or risk of bleeding: yes

## 2014-04-18 NOTE — Anesthesia Procedure Notes (Signed)
Procedure Name: Intubation Date/Time: 04/18/2014 9:15 AM Performed by: Lieutenant Diego Pre-anesthesia Checklist: Patient identified, Emergency Drugs available, Suction available and Patient being monitored Patient Re-evaluated:Patient Re-evaluated prior to inductionOxygen Delivery Method: Circle System Utilized Preoxygenation: Pre-oxygenation with 100% oxygen Intubation Type: IV induction Ventilation: Mask ventilation without difficulty Laryngoscope Size: Miller and 2 Grade View: Grade I Tube type: Oral Number of attempts: 1 Airway Equipment and Method: stylet and oral airway Placement Confirmation: ETT inserted through vocal cords under direct vision,  positive ETCO2 and breath sounds checked- equal and bilateral Secured at: 22 cm Tube secured with: Tape Dental Injury: Teeth and Oropharynx as per pre-operative assessment

## 2014-04-18 NOTE — Discharge Instructions (Signed)
°  [name],Rebecca L Activity (include date of return to work if known) As tolerated: NO showers NO driving No heavy activities  Diet:regular No restrictions:  Wound Care: Keep dressing clean & dry  Do not change dressings Special Instructions: Do not raise arms up Call Doctor if any unusual problems occur such as pain, excessive Bleeding, unrelieved Nausea/vomiting, Fever &/or chills When lying down, keep head elevated on 2-3 pillows or back-rest Follow-up appointment: Please call the office.  The patient received discharge instruction from:___________________________________________ Patient signature ________________________________________ / Date___________ Signature of individual providing instructions/ Date________________              Call your surgeon if you experience:   1.  Fever over 101.0. 2.  Inability to urinate. 3.  Nausea and/or vomiting. 4.  Extreme swelling or bruising at the surgical site. 5.  Continued bleeding from the incision. 6.  Increased pain, redness or drainage from the incision. 7.  Problems related to your pain medication. 8. Any change in color, movement and/or sensation 9. Any problems and/or concerns  Post Anesthesia Home Care Instructions  Activity: Get plenty of rest for the remainder of the day. A responsible adult should stay with you for 24 hours following the procedure.  For the next 24 hours, DO NOT: -Drive a car -Paediatric nurse -Drink alcoholic beverages -Take any medication unless instructed by your physician -Make any legal decisions or sign important papers.  Meals: Start with liquid foods such as gelatin or soup. Progress to regular foods as tolerated. Avoid greasy, spicy, heavy foods. If nausea and/or vomiting occur, drink only clear liquids until the nausea and/or vomiting subsides. Call your physician if vomiting continues.  Special Instructions/Symptoms: Your throat may feel dry or sore from the anesthesia or the  breathing tube placed in your throat during surgery. If this causes discomfort, gargle with warm salt water. The discomfort should disappear within 24 hours.

## 2014-04-19 ENCOUNTER — Encounter (HOSPITAL_BASED_OUTPATIENT_CLINIC_OR_DEPARTMENT_OTHER): Payer: Self-pay | Admitting: Specialist

## 2014-04-19 NOTE — Op Note (Signed)
NAMEZULA, HOVSEPIAN NO.:  [id number]  MEDICAL RECORD NO.:  [id number]  LOCATION:                                 FACILITY:  PHYSICIAN:  Berneta Sages L. Towanda Malkin, M.D.DATE OF BIRTH:  Sep 08, 1970  DATE OF PROCEDURE:  04/18/2014 DATE OF DISCHARGE:  04/18/2014                              OPERATIVE REPORT   A 43 year old lady with history of bilateral cancer of the breasts. Previous reconstruction was done by another surgeon approximately year and a half ago, had complications.  I re-did surgeries done approximately 6 months ago and reconstruction with tissue expanders. She has severe scar cicatrix involving the whole right breast area and we had been able to take out most of that in a serial excision technique over time.  At this point, she is mentioned a fairly well, ecstatic, improving.  She does on the right side have an inframammary fold approximately 0.5 cm higher, with on a lower that on this visit as well as reconstruction of the right and left nipple-areolar complexes using a full-thickness skin graft and using skate flaps.  All procedures in detail were explained to the patient preoperatively.  She understands consents to surgery, understand no 100% guarantee of results as well as understand the possible risks and complications.  Preoperatively, the patient was sat up and drawn for the midline of the chest as well as the right and left inframammary fold, marked the right inframammary fold down the lower approximately 2 cm to measure for the left.  We also measured the triangle at 20 cm from the suprasternal notch and then I did drawings there to show where the new nipple-areolar complex would be located.  She then underwent general anesthesia and intubated orally. Prep was done to the chest, breast areas and groin, abdominal regions using Hibiclens soap and solution, walled off with sterile towels and drapes so as to make a sterile field.  A 0.5% Xylocaine  with epinephrine was injected locally at the right and left groin areas for the graft salvage.  She also underwent injections of the new nipple-areolar complex areas with a #25-gauge needle.  We were able to trace out the areas for the graft excision of the right and left groin regions.  A #15 blade was used to excise down the underlying subcutaneous tissue in elliptical fashion.  The superior and inferior flaps were freed up and then closure was done with 2-0 Monocryl x2 layers and a running subcuticular stitch of 3-0 Monocryl.  The grafts were defatted and then placed in saline and 4x4s.  Next, attention was drawn to the right and left new areas for the areolar complex reconstructions and nipples. Skate flaps were elevated with #15 blade on the left side, elevation with good subcutaneous fat tissue and I was lifted up the skate and then tacked the bottoms to give good elevation to the areas.  Next, the resulting skin around the area was de-epithelialized with #15 blade and 10 blades and the previous graft was then placed over the defect and secured with 4-0 Prolene sutures in four spots; 12 o'clock, 3 o'clock, 6 o'clock and 9 o'clock positions.  The grafts were trimmed and  then a running 4-0 Prolene was done around the whole graft site.  Next using #11 blade, I was able to bring the skate flap through with excellent symmetry on the left side.  On the right side, which had real bad trouble healing with severe scar cicatrix, which was excised previously, felt low, less confident about what the blood supply might be, but we elevated up likewise to the left side and surprisingly had more blood supply did not thought would have been.  Fat maybe not quite as much, but enough for me to pull up and make another tent tacking the bases with the 4-0 Prolene sutures.  The other area was de-epithelialized graft from the right side, which was taken and defatted, was placed over the defect and secured  with sutures of 4-0 Prolene.  A #11-blade was used to bring the nipple back through with excellent symmetry as compared to the right side.  Also preoperatively, we noticed that the right breast tissue expansion was a little bit smaller than the left. So, we put 75 mL through the port of saline injectable sterile to even up to size this.  The wounds looked good.  All the areas were covered with sterile dressings.  The stents were tied down with 4-0 Prolene on the nipple-areolar complex areas.  4x4s and ABDs were placed, and a breast garment device.  She tolerated all the procedures very well.  ESTIMATED BLOOD LOSS:  Less than 100 mL.  COMPLICATIONS:  None.  ADDENDUM:  During that time, we did the right breast, I was able to make an incision in the right inframammary new fold area with #15 blade. This was carried down through the skin and subcutaneous tissue to underlying capsule.  The capsule was opened.  Then, using light retraction, I was able to dissect down with the Bovie approximately 2 cm to allow that implant to fall in place and to be even more so with the left side.  After proper hemostasis, that muscle was then reclosed with 2-0 Monocryl subcutaneous tissue, 2-0 Monocryl x2 layers and then a running subcuticular stitch with 3-0 Monocryl.  Steri-Strips and soft dressing were applied in that area as well.     Odella Aquas. Towanda Malkin, M.D.     Elie Confer  D:  04/18/2014  T:  04/19/2014  Job:  [id number]

## 2014-07-26 ENCOUNTER — Other Ambulatory Visit: Payer: Self-pay

## 2015-09-06 ENCOUNTER — Other Ambulatory Visit: Payer: Self-pay | Admitting: Internal Medicine

## 2015-09-06 ENCOUNTER — Ambulatory Visit
Admission: RE | Admit: 2015-09-06 | Discharge: 2015-09-06 | Disposition: A | Discharge status: Home / Self Care | Payer: Managed Care, Other (non HMO) | Source: Ambulatory Visit | Attending: Internal Medicine | Admitting: Internal Medicine

## 2015-09-06 DIAGNOSIS — R059 Cough, unspecified: Secondary | ICD-10-CM

## 2015-09-06 DIAGNOSIS — R05 Cough: Secondary | ICD-10-CM

## 2015-09-06 DIAGNOSIS — R062 Wheezing: Secondary | ICD-10-CM

## 2015-10-03 ENCOUNTER — Other Ambulatory Visit: Payer: Self-pay | Admitting: Internal Medicine

## 2015-10-03 ENCOUNTER — Ambulatory Visit
Admission: RE | Admit: 2015-10-03 | Discharge: 2015-10-03 | Disposition: A | Discharge status: Home / Self Care | Payer: Managed Care, Other (non HMO) | Source: Ambulatory Visit | Attending: Internal Medicine | Admitting: Internal Medicine

## 2015-10-03 DIAGNOSIS — R05 Cough: Secondary | ICD-10-CM

## 2015-10-03 DIAGNOSIS — R1111 Vomiting without nausea: Secondary | ICD-10-CM

## 2015-10-03 DIAGNOSIS — R059 Cough, unspecified: Secondary | ICD-10-CM

## 2015-10-03 DIAGNOSIS — R945 Abnormal results of liver function studies: Secondary | ICD-10-CM

## 2015-10-03 DIAGNOSIS — R7989 Other specified abnormal findings of blood chemistry: Secondary | ICD-10-CM

## 2015-10-03 DIAGNOSIS — R112 Nausea with vomiting, unspecified: Secondary | ICD-10-CM

## 2015-10-06 ENCOUNTER — Other Ambulatory Visit: Payer: Managed Care, Other (non HMO)

## 2015-11-15 ENCOUNTER — Ambulatory Visit
Admission: RE | Admit: 2015-11-15 | Discharge: 2015-11-15 | Disposition: A | Discharge status: Home / Self Care | Payer: Managed Care, Other (non HMO) | Source: Ambulatory Visit | Attending: Internal Medicine | Admitting: Internal Medicine

## 2015-11-15 DIAGNOSIS — R112 Nausea with vomiting, unspecified: Secondary | ICD-10-CM

## 2015-11-15 DIAGNOSIS — R7989 Other specified abnormal findings of blood chemistry: Secondary | ICD-10-CM

## 2015-11-15 DIAGNOSIS — R945 Abnormal results of liver function studies: Secondary | ICD-10-CM

## 2015-12-05 ENCOUNTER — Ambulatory Visit: Payer: Self-pay | Admitting: Surgery

## 2015-12-05 NOTE — H&P (Signed)
Rebecca Rice 12/05/2015 8:38 AM Location: [facility] Patient #: [id number] DOB: Feb 07, 1971 Single / Language: Rebecca Rice / Race: Black or African American Female  History of Present Illness Adin Hector MD; 12/05/2015 9:18 AM) Patient words: gallstones.  The patient is a 45 year old female who presents for evaluation of gallbladder disease. Note for "Gallbladder disease": Patient sent for surgical consultation at the request of her primary care physician, Dr. Jilda Panda, for concerns of a symptomatic gallbladder.  Pleasant obese female. History of seasonal allergies and high blood pressure. She recalls being told that she had fatty liver. Mildly elevated liver function tests for a while. See. Nonetheless, she had an episode of chronic cough and chest discomfort was diagnosed with a pneumonia. Treated with azithromycin. Gradually improved. However she kept having persistent epigastric episodes of discomfort. She would get episodes of sharp right upper quadrant and epigastric pain. Usually triggered by fatty foods or heavy eating. Would get nauseated. The attacks are happening more often. Now she is vomiting. She's had mild heartburn issues in the past but usually treated with over-the-counter medications. Nothing new with that. This does not feel like that. No sick contacts or travel history. She usually has a bowel movement every day. She can walk a couple miles without difficulty and tries to stay active. She does not smoke. No diabetes.  Given the more classic story of gallbladder attacks ultrasound was ordered. No fatty changes of liver cirrhosis. Obvious gallstones. No cholecystitis. Surgical consultation requested. No personal nor family history of GI/colon cancer, inflammatory bowel disease, irritable bowel syndrome, allergy such as Celiac Sprue, dietary/dairy problems, colitis, ulcers nor gastritis. No recent sick contacts/gastroenteritis. No travel  outside the country. No changes in diet. No dysphagia to solids or liquids. No significant heartburn or reflux. No hematochezia, hematemesis, coffee ground emesis. No evidence of prior gastric/peptic ulceration.   Diagnostic Studies History Marjean Donna, CMA; 12/05/2015 8:38 AM) Colonoscopy never  Allergies (Sonya Bynum, CMA; 12/05/2015 8:40 AM) Latex Exam Gloves *MEDICAL DEVICES AND SUPPLIES* Vicodin *ANALGESICS - OPIOID* Adhesive Remover Wipes *MEDICAL DEVICES AND SUPPLIES*  Medication History Marjean Donna, CMA; 12/05/2015 8:43 AM) Albuterol (90MCG/ACT Aerosol Soln, Inhalation) Active. ZyrTEC Allergy (10MG  Capsule, Oral) Active. KlonoPIN (1MG  Tablet, Oral) Active. Catapres (0.1MG  Tablet, Oral) Active. Losartan Potassium-HCTZ (100-25MG  Tablet, Oral) Active. Latuda (40MG  Tablet, Oral) Active. Invokamet XR (150-1000MG  Tablet ER 24HR, Oral) Active. Asmanex HFA (200MCG/ACT Aerosol, Inhalation) Active. Medications Reconciled     Review of Systems ([place]; 12/05/2015 8:38 AM) Skin Present- Dryness. Not Present- Change in Wart/Mole, Hives, Jaundice, New Lesions, Non-Healing Wounds, Rash and Ulcer. HEENT Present- Sinus Pain. Not Present- Earache, Hearing Loss, Hoarseness, Nose Bleed, Oral Ulcers, Ringing in the Ears, Seasonal Allergies, Sore Throat, Visual Disturbances, Wears glasses/contact lenses and Yellow Eyes. Cardiovascular Present- Leg Cramps. Not Present- Chest Pain, Difficulty Breathing Lying Down, Palpitations, Rapid Heart Rate, Shortness of Breath and Swelling of Extremities.  Vitals (Sonya Bynum CMA; 12/05/2015 8:39 AM) 12/05/2015 8:39 AM Weight: 203 lb Height: 62in Body Surface Area: 1.92 m Body Mass Index: 37.13 kg/m  Temp.: 97.52F(Temporal)  Pulse: 76 (Regular)  BP: 130/80 (Sitting, Left Arm, Standard)      Physical Exam Adin Hector MD; 12/05/2015 9:07 AM)  General Mental Status-Alert. General Appearance-Not in acute distress,  Not Sickly. Orientation-Oriented X3. Hydration-Well hydrated. Voice-Normal.  Integumentary Global Assessment Upon inspection and palpation of skin surfaces of the - Axillae: non-tender, no inflammation or ulceration, no drainage. and Distribution of scalp and body hair  is normal. General Characteristics Temperature - normal warmth is noted.  Head and Neck Head-normocephalic, atraumatic with no lesions or palpable masses. Face Global Assessment - atraumatic, no absence of expression. Neck Global Assessment - no abnormal movements, no bruit auscultated on the right, no bruit auscultated on the left, no decreased range of motion, non-tender. Trachea-midline. Thyroid Gland Characteristics - non-tender.  Eye Eyeball - Left-Extraocular movements intact, No Nystagmus. Eyeball - Right-Extraocular movements intact, No Nystagmus. Cornea - Left-No Hazy. Cornea - Right-No Hazy. Sclera/Conjunctiva - Left-No scleral icterus, No Discharge. Sclera/Conjunctiva - Right-No scleral icterus, No Discharge. Pupil - Left-Direct reaction to light normal. Pupil - Right-Direct reaction to light normal.  ENMT Ears Pinna - Left - no drainage observed, no generalized tenderness observed. Right - no drainage observed, no generalized tenderness observed. Nose and Sinuses External Inspection of the Nose - no destructive lesion observed. Inspection of the nares - Left - quiet respiration. Right - quiet respiration. Mouth and Throat Lips - Upper Lip - no fissures observed, no pallor noted. Lower Lip - no fissures observed, no pallor noted. Nasopharynx - no discharge present. Oral Cavity/Oropharynx - Tongue - no dryness observed. Oral Mucosa - no cyanosis observed. Hypopharynx - no evidence of airway distress observed.  Chest and Lung Exam Inspection Movements - Normal and Symmetrical. Accessory muscles - No use of accessory muscles in breathing. Palpation Palpation of the chest  reveals - Non-tender. Auscultation Breath sounds - Normal and Clear.  Cardiovascular Auscultation Rhythm - Regular. Murmurs & Other Heart Sounds - Auscultation of the heart reveals - No Murmurs and No Systolic Clicks.  Abdomen Inspection Inspection of the abdomen reveals - No Visible peristalsis and No Abnormal pulsations. Umbilicus - No Bleeding, No Urine drainage. Palpation/Percussion Palpation and Percussion of the abdomen reveal - Soft, Non Tender, No Rebound tenderness, No Rigidity (guarding) and No Cutaneous hyperesthesia. Note: Obese but soft. Discomfort right upper quadrant greater than epigastric region. No Murphy sign. No hernias. No umbilical hernia. No diastases. No peritonitis. No rebound tenderness.  Female Genitourinary Sexual Maturity Tanner 5 - Adult hair pattern. Note: No vaginal bleeding nor discharge. No irritation on mons pubis. No inguinal hernias.  Peripheral Vascular Upper Extremity Inspection - Left - No Cyanotic nailbeds, Not Ischemic. Right - No Cyanotic nailbeds, Not Ischemic.  Neurologic Neurologic evaluation reveals -normal attention span and ability to concentrate, able to name objects and repeat phrases. Appropriate fund of knowledge , normal sensation and normal coordination. Mental Status Affect - not angry, not paranoid. Cranial Nerves-Normal Bilaterally. Gait-Normal.  Neuropsychiatric Mental status exam performed with findings of-able to articulate well with normal speech/language, rate, volume and coherence, thought content normal with ability to perform basic computations and apply abstract reasoning and no evidence of hallucinations, delusions, obsessions or homicidal/suicidal ideation.  Musculoskeletal Global Assessment Spine, Ribs and Pelvis - no instability, subluxation or laxity. Right Upper Extremity - no instability, subluxation or laxity.  Lymphatic Head & Neck  General Head & Neck Lymphatics: Bilateral - Description -  No Localized lymphadenopathy. Axillary  General Axillary Region: Bilateral - Description - No Localized lymphadenopathy. Femoral & Inguinal  Generalized Femoral & Inguinal Lymphatics: Left - Description - No Localized lymphadenopathy. Right - Description - No Localized lymphadenopathy.    Assessment & Plan Adin Hector MD; 12/05/2015 9:15 AM)  CHRONIC CHOLECYSTITIS WITH CALCULUS (K80.10) Impression: Very classic story of biliary colic attacks with now more chronic soreness and in increasing frequency and intensity of attacks. I think she would benefit from cholecystectomy. Rest  of the differential diagnosis seems on likely. She agrees to proceed.  Current Plans You are being scheduled for surgery - Our schedulers will call you.  You should hear from our office's scheduling department within 5 working days about the location, date, and time of surgery. We try to make accommodations for patient's preferences in scheduling surgery, but sometimes the OR schedule or the surgeon's schedule prevents [place] from making those accommodations.  If you have not heard from our office [phone]) in 5 working days, call the office and ask for your surgeon's nurse.  If you have other questions about your diagnosis, plan, or surgery, call the office and ask for your surgeon's nurse.  The anatomy & physiology of hepatobiliary & pancreatic function was discussed. The pathophysiology of gallbladder dysfunction was discussed. Natural history risks without surgery was discussed. I feel the risks of no intervention will lead to serious problems that outweigh the operative risks; therefore, I recommended cholecystectomy to remove the pathology. I explained laparoscopic techniques with possible need for an open approach. Probable cholangiogram to evaluate the bilary tract was explained as well.  Risks such as bleeding, infection, abscess, leak, injury to other organs, need for further treatment, heart attack,  death, and other risks were discussed. I noted a good likelihood this will help address the problem. Possibility that this will not correct all abdominal symptoms was explained. Goals of post-operative recovery were discussed as well. We will work to minimize complications. An educational handout further explaining the pathology and treatment options was given as well. Questions were answered. The patient expresses understanding & wishes to proceed with surgery.  Pt Education - Pamphlet Given - Laparoscopic Gallbladder Surgery: discussed with patient and provided information. Written instructions provided Pt Education - [facility] Laparosopic Post Op HCI (Ishani Goldwasser) Pt Education - [place] (Amoni Morales) Pt Education - Laparoscopic Cholecystectomy: gallbladder HEPATITIS (K75.9) Impression: Mildly elevated liver function tests.  Probably related to her very symptomatic cholecystolithiasis and chronic cholecystitis. She's been told she has fatty change in the liver or some chronic hepatitis issues which concerned her. No history of viral exposure. Offer to do a core liver biopsy at the same time to confirm or dissuade the suspicion  Adin Hector, M.D., F.A.C.S. Gastrointestinal and Minimally Invasive Surgery [facility] [address] [place], [place] [address] [phone] Main / Paging

## 2015-12-26 NOTE — Patient Instructions (Addendum)
Rebecca Rice  12/26/2015   Your procedure is scheduled on: 12-29-15  Report to [facility] Main  Entrance take [facility]  elevators to 3rd floor to  [place] at 530  AM.  Call this number if you have problems the morning of surgery [phone]   Remember: ONLY 1 PERSON MAY GO WITH YOU TO SHORT STAY TO GET  READY MORNING OF [place].  Do not eat food or drink liquids :After Midnight.     Take these medicines the morning of surgery with A SIP OF WATER: ALBUTEROL INHALER IF NEEDED AND BRING INHALER WITH YOU,  TAKE 1/2 DOSE BEDTIME LANTUS INSULIN ON 12-28-15 DO NOT TAKE ANY DIABETIC MEDICATIONS DAY OF YOUR SURGERY                               You may not have any metal on your body including hair pins and              piercings  Do not wear jewelry, make-up, lotions, powders or perfumes, deodorant             Do not wear nail polish.  Do not shave  48 hours prior to surgery.              Men may shave face and neck.   Do not bring valuables to the hospital. [place].  Contacts, dentures or bridgework may not be worn into surgery.  Leave suitcase in the car. After surgery it may be brought to your room.     Patients discharged the day of surgery will not be allowed to drive home.  Name and phone number of your driver:spouse Rebecca Rice cell [phone]  Special Instructions: N/A              Please read over the following fact sheets you were given: _____________________________________________________________________             [facility] - Preparing for Surgery Before surgery, you can play an important role.  Because skin is not sterile, your skin needs to be as free of germs as possible.  You can reduce the number of germs on your skin by washing with CHG (chlorahexidine gluconate) soap before surgery.  CHG is an antiseptic cleaner which kills germs and bonds with the skin to continue killing  germs even after washing. Please DO NOT use if you have an allergy to CHG or antibacterial soaps.  If your skin becomes reddened/irritated stop using the CHG and inform your nurse when you arrive at Short Stay. Do not shave (including legs and underarms) for at least 48 hours prior to the first CHG shower.  You may shave your face/neck. Please follow these instructions carefully:  1.  Shower with CHG Soap the night before surgery and the  morning of Surgery.  2.  If you choose to wash your hair, wash your hair first as usual with your  normal  shampoo.  3.  After you shampoo, rinse your hair and body thoroughly to remove the  shampoo.                           4.  Use  CHG as you would any other liquid soap.  You can apply chg directly  to the skin and wash                       Gently with a scrungie or clean washcloth.  5.  Apply the CHG Soap to your body ONLY FROM THE NECK DOWN.   Do not use on face/ open                           Wound or open sores. Avoid contact with eyes, ears mouth and genitals (private parts).                       Wash face,  Genitals (private parts) with your normal soap.             6.  Wash thoroughly, paying special attention to the area where your surgery  will be performed.  7.  Thoroughly rinse your body with warm water from the neck down.  8.  DO NOT shower/wash with your normal soap after using and rinsing off  the CHG Soap.                9.  Pat yourself dry with a clean towel.            10.  Wear clean pajamas.            11.  Place clean sheets on your bed the night of your first shower and do not  sleep with pets. Day of Surgery : Do not apply any lotions/deodorants the morning of surgery.  Please wear clean clothes to the hospital/surgery center.  FAILURE TO FOLLOW THESE INSTRUCTIONS MAY RESULT IN THE CANCELLATION OF YOUR SURGERY PATIENT SIGNATURE_________________________________  NURSE  SIGNATURE__________________________________  ________________________________________________________________________

## 2015-12-26 NOTE — Progress Notes (Signed)
CHEST XRAY 10-03-15 EPIC

## 2015-12-27 ENCOUNTER — Encounter (HOSPITAL_COMMUNITY): Payer: Self-pay

## 2015-12-27 ENCOUNTER — Encounter (HOSPITAL_COMMUNITY)
Admission: RE | Admit: 2015-12-27 | Discharge: 2015-12-27 | Disposition: A | Discharge status: Home / Self Care | Payer: Commercial Managed Care - HMO | Source: Ambulatory Visit | Attending: Surgery | Admitting: Surgery

## 2015-12-27 DIAGNOSIS — Z794 Long term (current) use of insulin: Secondary | ICD-10-CM | POA: Diagnosis not present

## 2015-12-27 DIAGNOSIS — Z6837 Body mass index (BMI) 37.0-37.9, adult: Secondary | ICD-10-CM | POA: Diagnosis not present

## 2015-12-27 DIAGNOSIS — E669 Obesity, unspecified: Secondary | ICD-10-CM | POA: Diagnosis not present

## 2015-12-27 DIAGNOSIS — Z8673 Personal history of transient ischemic attack (TIA), and cerebral infarction without residual deficits: Secondary | ICD-10-CM | POA: Diagnosis not present

## 2015-12-27 DIAGNOSIS — F319 Bipolar disorder, unspecified: Secondary | ICD-10-CM | POA: Diagnosis not present

## 2015-12-27 DIAGNOSIS — J45909 Unspecified asthma, uncomplicated: Secondary | ICD-10-CM | POA: Diagnosis not present

## 2015-12-27 DIAGNOSIS — I10 Essential (primary) hypertension: Secondary | ICD-10-CM | POA: Diagnosis not present

## 2015-12-27 DIAGNOSIS — E109 Type 1 diabetes mellitus without complications: Secondary | ICD-10-CM | POA: Diagnosis not present

## 2015-12-27 DIAGNOSIS — F419 Anxiety disorder, unspecified: Secondary | ICD-10-CM | POA: Diagnosis not present

## 2015-12-27 DIAGNOSIS — Z79899 Other long term (current) drug therapy: Secondary | ICD-10-CM | POA: Diagnosis not present

## 2015-12-27 DIAGNOSIS — K8010 Calculus of gallbladder with chronic cholecystitis without obstruction: Secondary | ICD-10-CM | POA: Diagnosis not present

## 2015-12-27 DIAGNOSIS — K739 Chronic hepatitis, unspecified: Secondary | ICD-10-CM | POA: Diagnosis not present

## 2015-12-27 HISTORY — DX: Anxiety disorder, unspecified: F41.9

## 2015-12-27 HISTORY — DX: Abnormal levels of other serum enzymes: R74.8

## 2015-12-27 LAB — COMPREHENSIVE METABOLIC PANEL
ALT: 146 U/L — AB (ref 14–54)
AST: 126 U/L — AB (ref 15–41)
Albumin: 4.4 g/dL (ref 3.5–5.0)
Alkaline Phosphatase: 30 U/L — ABNORMAL LOW (ref 38–126)
Anion gap: 10 (ref 5–15)
BUN: 19 mg/dL (ref 6–20)
CHLORIDE: 106 mmol/L (ref 101–111)
CO2: 21 mmol/L — AB (ref 22–32)
CREATININE: 0.85 mg/dL (ref 0.44–1.00)
Calcium: 10.2 mg/dL (ref 8.9–10.3)
GFR calc Af Amer: >60 mL/min (ref >60)
Glucose, Bld: 158 mg/dL — ABNORMAL HIGH (ref 65–99)
Potassium: 4.3 mmol/L (ref 3.5–5.1)
Sodium: 137 mmol/L (ref 135–145)
Total Bilirubin: 1.3 mg/dL — ABNORMAL HIGH (ref 0.3–1.2)
Total Protein: 8.2 g/dL — ABNORMAL HIGH (ref 6.5–8.1)

## 2015-12-27 LAB — HCG, SERUM, QUALITATIVE: PREG SERUM: NEGATIVE

## 2015-12-27 LAB — CBC
HCT: 43.9 % (ref 36.0–46.0)
Hemoglobin: 14.7 g/dL (ref 12.0–15.0)
MCH: 32 pg (ref 26.0–34.0)
MCHC: 33.5 g/dL (ref 30.0–36.0)
MCV: 95.4 fL (ref 78.0–100.0)
PLATELETS: 263 10*3/uL (ref 150–400)
RBC: 4.6 MIL/uL (ref 3.87–5.11)
RDW: 13.6 % (ref 11.5–15.5)
WBC: 5.9 10*3/uL (ref 4.0–10.5)

## 2015-12-27 NOTE — Progress Notes (Signed)
   12/27/15 0834  OBSTRUCTIVE SLEEP APNEA  Have you ever been diagnosed with sleep apnea through a sleep study? No  Do you snore loudly (loud enough to be heard through closed doors)?  1  Do you often feel tired, fatigued, or sleepy during the daytime (such as falling asleep during driving or talking to someone)? 1  Has anyone observed you stop breathing during your sleep? 1  Do you have, or are you being treated for high blood pressure? 1  BMI more than 35 kg/m2? 1  Age > 50 (1-yes) 0  Neck circumference greater than:Female 16 inches or larger, Female 17inches or larger? 1 (16.1 in ches)  Female Gender (Yes=1) 0  Obstructive Sleep Apnea Score 6

## 2015-12-28 LAB — HEMOGLOBIN A1C
Hgb A1c MFr Bld: 5.4 % (ref 4.8–5.6)
MEAN PLASMA GLUCOSE: 108 mg/dL

## 2015-12-29 ENCOUNTER — Ambulatory Visit (HOSPITAL_COMMUNITY): Payer: Commercial Managed Care - HMO | Admitting: Certified Registered Nurse Anesthetist

## 2015-12-29 ENCOUNTER — Encounter (HOSPITAL_COMMUNITY): Payer: Self-pay | Admitting: Certified Registered Nurse Anesthetist

## 2015-12-29 ENCOUNTER — Ambulatory Visit (HOSPITAL_COMMUNITY)
Admission: RE | Admit: 2015-12-29 | Discharge: 2015-12-29 | Disposition: A | Discharge status: Home / Self Care | Payer: Commercial Managed Care - HMO | Source: Ambulatory Visit | Attending: Surgery | Admitting: Surgery

## 2015-12-29 ENCOUNTER — Ambulatory Visit (HOSPITAL_COMMUNITY): Payer: Commercial Managed Care - HMO

## 2015-12-29 ENCOUNTER — Encounter (HOSPITAL_COMMUNITY)
Admission: RE | Disposition: A | Discharge status: Home / Self Care | Payer: Self-pay | Source: Ambulatory Visit | Attending: Surgery

## 2015-12-29 DIAGNOSIS — E669 Obesity, unspecified: Secondary | ICD-10-CM | POA: Insufficient documentation

## 2015-12-29 DIAGNOSIS — F419 Anxiety disorder, unspecified: Secondary | ICD-10-CM | POA: Insufficient documentation

## 2015-12-29 DIAGNOSIS — I10 Essential (primary) hypertension: Secondary | ICD-10-CM | POA: Insufficient documentation

## 2015-12-29 DIAGNOSIS — K8010 Calculus of gallbladder with chronic cholecystitis without obstruction: Secondary | ICD-10-CM | POA: Diagnosis not present

## 2015-12-29 DIAGNOSIS — F319 Bipolar disorder, unspecified: Secondary | ICD-10-CM | POA: Insufficient documentation

## 2015-12-29 DIAGNOSIS — Z419 Encounter for procedure for purposes other than remedying health state, unspecified: Secondary | ICD-10-CM

## 2015-12-29 DIAGNOSIS — E109 Type 1 diabetes mellitus without complications: Secondary | ICD-10-CM | POA: Insufficient documentation

## 2015-12-29 DIAGNOSIS — Z6837 Body mass index (BMI) 37.0-37.9, adult: Secondary | ICD-10-CM | POA: Insufficient documentation

## 2015-12-29 DIAGNOSIS — Z794 Long term (current) use of insulin: Secondary | ICD-10-CM | POA: Insufficient documentation

## 2015-12-29 DIAGNOSIS — Z8673 Personal history of transient ischemic attack (TIA), and cerebral infarction without residual deficits: Secondary | ICD-10-CM | POA: Insufficient documentation

## 2015-12-29 DIAGNOSIS — Z79899 Other long term (current) drug therapy: Secondary | ICD-10-CM | POA: Insufficient documentation

## 2015-12-29 DIAGNOSIS — K739 Chronic hepatitis, unspecified: Secondary | ICD-10-CM | POA: Insufficient documentation

## 2015-12-29 DIAGNOSIS — J45909 Unspecified asthma, uncomplicated: Secondary | ICD-10-CM | POA: Insufficient documentation

## 2015-12-29 HISTORY — PX: LIVER BIOPSY: SHX301

## 2015-12-29 HISTORY — PX: LAPAROSCOPIC CHOLECYSTECTOMY SINGLE SITE WITH INTRAOPERATIVE CHOLANGIOGRAM: SHX6538

## 2015-12-29 LAB — GLUCOSE, CAPILLARY
GLUCOSE-CAPILLARY: 122 mg/dL — AB (ref 65–99)
Glucose-Capillary: 128 mg/dL — ABNORMAL HIGH (ref 65–99)

## 2015-12-29 SURGERY — LAPAROSCOPIC CHOLECYSTECTOMY SINGLE SITE WITH INTRAOPERATIVE CHOLANGIOGRAM
Anesthesia: General

## 2015-12-29 MED ORDER — HYDROMORPHONE HCL 1 MG/ML IJ SOLN: 0.2500 mg | INTRAMUSCULAR | Status: DC | PRN

## 2015-12-29 MED ORDER — LIDOCAINE HCL (CARDIAC) 20 MG/ML IV SOLN
INTRAVENOUS | Status: DC | PRN
  Administered 2015-12-29: 100 mg via INTRAVENOUS

## 2015-12-29 MED ORDER — SUGAMMADEX SODIUM 500 MG/5ML IV SOLN
INTRAVENOUS | Status: DC | PRN
Start: 2015-12-29 — End: 2015-12-29
  Administered 2015-12-29 (×2): 200 mg via INTRAVENOUS

## 2015-12-29 MED ORDER — HYDROMORPHONE 1 MG/ML IV SOLN
INTRAVENOUS | Status: AC
  Filled 2015-12-29: qty 25

## 2015-12-29 MED ORDER — HYDROMORPHONE HCL 4 MG PO TABS: 2.0000 mg | ORAL_TABLET | Freq: Four times a day (QID) | ORAL | Status: DC | PRN

## 2015-12-29 MED ORDER — SODIUM CHLORIDE 0.9 % IV SOLN
INTRAVENOUS | Status: DC | PRN
  Administered 2015-12-29: 5 mL

## 2015-12-29 MED ORDER — HYDROMORPHONE HCL 1 MG/ML IJ SOLN
INTRAMUSCULAR | Status: AC
  Filled 2015-12-29: qty 1

## 2015-12-29 MED ORDER — ONDANSETRON HCL 4 MG/2ML IJ SOLN
INTRAMUSCULAR | Status: AC
  Filled 2015-12-29: qty 2

## 2015-12-29 MED ORDER — LIDOCAINE HCL (CARDIAC) 20 MG/ML IV SOLN
INTRAVENOUS | Status: AC
  Filled 2015-12-29: qty 5

## 2015-12-29 MED ORDER — EPHEDRINE SULFATE 50 MG/ML IJ SOLN
INTRAMUSCULAR | Status: AC
  Filled 2015-12-29: qty 1

## 2015-12-29 MED ORDER — BUPIVACAINE-EPINEPHRINE 0.25% -1:200000 IJ SOLN
INTRAMUSCULAR | Status: DC | PRN
  Administered 2015-12-29: 50 mL

## 2015-12-29 MED ORDER — HYDROMORPHONE HCL 2 MG PO TABS
1.0000 mg | ORAL_TABLET | Freq: Once | ORAL | Status: AC
  Administered 2015-12-29: 2 mg via ORAL
  Filled 2015-12-29: qty 1

## 2015-12-29 MED ORDER — PROCHLORPERAZINE EDISYLATE 5 MG/ML IJ SOLN: 10.0000 mg | Freq: Once | INTRAMUSCULAR | Status: DC | PRN

## 2015-12-29 MED ORDER — IOPAMIDOL (ISOVUE-300) INJECTION 61%
INTRAVENOUS | Status: AC
  Filled 2015-12-29: qty 50

## 2015-12-29 MED ORDER — MIDAZOLAM HCL 2 MG/2ML IJ SOLN
INTRAMUSCULAR | Status: AC
  Filled 2015-12-29: qty 2

## 2015-12-29 MED ORDER — ROCURONIUM BROMIDE 100 MG/10ML IV SOLN
INTRAVENOUS | Status: AC
  Filled 2015-12-29: qty 1

## 2015-12-29 MED ORDER — PHENYLEPHRINE HCL 10 MG/ML IJ SOLN
INTRAMUSCULAR | Status: DC | PRN
  Administered 2015-12-29: 80 ug via INTRAVENOUS
  Administered 2015-12-29 (×2): 120 ug via INTRAVENOUS
  Administered 2015-12-29: 80 ug via INTRAVENOUS

## 2015-12-29 MED ORDER — MIDAZOLAM HCL 5 MG/5ML IJ SOLN
INTRAMUSCULAR | Status: DC | PRN
  Administered 2015-12-29: 1 mg via INTRAVENOUS

## 2015-12-29 MED ORDER — METHOCARBAMOL 750 MG PO TABS: 750.0000 mg | ORAL_TABLET | Freq: Four times a day (QID) | ORAL | Status: DC | PRN

## 2015-12-29 MED ORDER — FENTANYL CITRATE (PF) 250 MCG/5ML IJ SOLN
INTRAMUSCULAR | Status: AC
  Filled 2015-12-29: qty 5

## 2015-12-29 MED ORDER — PROPOFOL 10 MG/ML IV BOLUS
INTRAVENOUS | Status: AC
  Filled 2015-12-29: qty 20

## 2015-12-29 MED ORDER — ROCURONIUM BROMIDE 100 MG/10ML IV SOLN
INTRAVENOUS | Status: DC | PRN
  Administered 2015-12-29: 40 mg via INTRAVENOUS
  Administered 2015-12-29: 10 mg via INTRAVENOUS

## 2015-12-29 MED ORDER — BUPIVACAINE-EPINEPHRINE 0.25% -1:200000 IJ SOLN
INTRAMUSCULAR | Status: AC
  Filled 2015-12-29: qty 1

## 2015-12-29 MED ORDER — LACTATED RINGERS IV SOLN
INTRAVENOUS | Status: DC | PRN
  Administered 2015-12-29 (×2): via INTRAVENOUS

## 2015-12-29 MED ORDER — GLYCOPYRROLATE 0.2 MG/ML IJ SOLN
INTRAMUSCULAR | Status: AC
  Filled 2015-12-29: qty 1

## 2015-12-29 MED ORDER — ONDANSETRON HCL 4 MG/2ML IJ SOLN
INTRAMUSCULAR | Status: DC | PRN
  Administered 2015-12-29: 4 mg via INTRAVENOUS

## 2015-12-29 MED ORDER — FENTANYL CITRATE (PF) 100 MCG/2ML IJ SOLN
INTRAMUSCULAR | Status: DC | PRN
Start: 2015-12-29 — End: 2015-12-29
  Administered 2015-12-29 (×2): 50 ug via INTRAVENOUS
  Administered 2015-12-29: 100 ug via INTRAVENOUS
  Administered 2015-12-29: 50 ug via INTRAVENOUS

## 2015-12-29 MED ORDER — PROPOFOL 10 MG/ML IV BOLUS
INTRAVENOUS | Status: DC | PRN
  Administered 2015-12-29: 150 mg via INTRAVENOUS

## 2015-12-29 MED ORDER — PHENYLEPHRINE HCL 10 MG/ML IJ SOLN
10.0000 mg | INTRAVENOUS | Status: DC | PRN
  Administered 2015-12-29: 100 ug/min via INTRAVENOUS

## 2015-12-29 SURGICAL SUPPLY — 40 items
APPLIER CLIP 5 13 M/L LIGAMAX5 (MISCELLANEOUS) ×2
CABLE HIGH FREQUENCY MONO STRZ (ELECTRODE) ×2 IMPLANT
CHLORAPREP W/TINT 26ML (MISCELLANEOUS) ×2 IMPLANT
CLIP APPLIE 5 13 M/L LIGAMAX5 (MISCELLANEOUS) ×1 IMPLANT
COVER MAYO STAND STRL (DRAPES) ×2 IMPLANT
COVER SURGICAL LIGHT HANDLE (MISCELLANEOUS) ×2 IMPLANT
DECANTER SPIKE VIAL GLASS SM (MISCELLANEOUS) ×2 IMPLANT
DRAIN CHANNEL 19F RND (DRAIN) IMPLANT
DRAPE C-ARM 42X120 X-RAY (DRAPES) ×2 IMPLANT
DRAPE LAPAROSCOPIC ABDOMINAL (DRAPES) ×2 IMPLANT
DRAPE WARM FLUID 44X44 (DRAPE) ×2 IMPLANT
DRSG TEGADERM 4X4.75 (GAUZE/BANDAGES/DRESSINGS) ×2 IMPLANT
ELECT REM PT RETURN 9FT ADLT (ELECTROSURGICAL) ×2
ELECTRODE REM PT RTRN 9FT ADLT (ELECTROSURGICAL) ×1 IMPLANT
ENDOLOOP SUT PDS II  0 18 (SUTURE)
ENDOLOOP SUT PDS II 0 18 (SUTURE) IMPLANT
EVACUATOR SILICONE 100CC (DRAIN) IMPLANT
GAUZE SPONGE 2X2 8PLY STRL LF (GAUZE/BANDAGES/DRESSINGS) ×1 IMPLANT
GAUZE SPONGE 4X4 12PLY STRL (GAUZE/BANDAGES/DRESSINGS) ×2 IMPLANT
GLOVE ECLIPSE 8.0 STRL XLNG CF (GLOVE) ×2 IMPLANT
GLOVE INDICATOR 8.0 STRL GRN (GLOVE) ×2 IMPLANT
GOWN STRL REUS W/TWL XL LVL3 (GOWN DISPOSABLE) ×4 IMPLANT
KIT BASIN OR (CUSTOM PROCEDURE TRAY) ×2 IMPLANT
PAD POSITIONING PINK XL (MISCELLANEOUS) ×2 IMPLANT
POSITIONER SURGICAL ARM (MISCELLANEOUS) IMPLANT
POUCH SPECIMEN RETRIEVAL 10MM (ENDOMECHANICALS) IMPLANT
SCISSORS LAP 5X35 DISP (ENDOMECHANICALS) ×2 IMPLANT
SET CHOLANGIOGRAPH MIX (MISCELLANEOUS) ×2 IMPLANT
SET IRRIG TUBING LAPAROSCOPIC (IRRIGATION / IRRIGATOR) ×2 IMPLANT
SHEARS HARMONIC ACE PLUS 36CM (ENDOMECHANICALS) ×2 IMPLANT
SPONGE GAUZE 2X2 STER 10/PKG (GAUZE/BANDAGES/DRESSINGS) ×1
SUT MNCRL AB 4-0 PS2 18 (SUTURE) ×2 IMPLANT
SUT PDS AB 1 CT1 27 (SUTURE) ×4 IMPLANT
SYR 20CC LL (SYRINGE) ×2 IMPLANT
TOWEL OR 17X26 10 PK STRL BLUE (TOWEL DISPOSABLE) ×2 IMPLANT
TOWEL OR NON WOVEN STRL DISP B (DISPOSABLE) ×2 IMPLANT
TRAY LAPAROSCOPIC (CUSTOM PROCEDURE TRAY) ×2 IMPLANT
TROCAR BLADELESS OPT 5 100 (ENDOMECHANICALS) ×2 IMPLANT
TROCAR BLADELESS OPT 5 150 (ENDOMECHANICALS) ×2 IMPLANT
TUBING INSUF HEATED (TUBING) ×2 IMPLANT

## 2015-12-29 NOTE — Discharge Instructions (Signed)
LAPAROSCOPIC SURGERY: POST OP INSTRUCTIONS ° °1. DIET: Follow a light bland diet the first 24 hours after arrival home, such as soup, liquids, crackers, etc.  Be sure to include lots of fluids daily.  Avoid fast food or heavy meals as your are more likely to get nauseated.  Eat a low fat the next few days after surgery.   °2. Take your usually prescribed home medications unless otherwise directed. °3. PAIN CONTROL: °a. Pain is best controlled by a usual combination of three different methods TOGETHER: °i. Ice/Heat °ii. Over the counter pain medication °iii. Prescription pain medication °b. Most patients will experience some swelling and bruising around the incisions.  Ice packs or heating pads (30-60 minutes up to 6 times a day) will help. Use ice for the first few days to help decrease swelling and bruising, then switch to heat to help relax tight/sore spots and speed recovery.  Some people prefer to use ice alone, heat alone, alternating between ice & heat.  Experiment to what works for you.  Swelling and bruising can take several weeks to resolve.   °c. It is helpful to take an over-the-counter pain medication regularly for the first few weeks.  Choose one of the following that works best for you: °i. Naproxen (Aleve, etc)  Two 220mg tabs twice a day °ii. Ibuprofen (Advil, etc) Three 200mg tabs four times a day (every meal & bedtime) °iii. Acetaminophen (Tylenol, etc) 500-650mg four times a day (every meal & bedtime) °d. A  prescription for pain medication (such as oxycodone, hydrocodone, etc) should be given to you upon discharge.  Take your pain medication as prescribed.  °i. If you are having problems/concerns with the prescription medicine (does not control pain, nausea, vomiting, rash, itching, etc), please call us [phone] to see if we need to switch you to a different pain medicine that will work better for you and/or control your side effect better. °ii. If you need a refill on your pain medication,  please contact your pharmacy.  They will contact our office to request authorization. Prescriptions will not be filled after 5 pm or on week-ends. °4. Avoid getting constipated.  Between the surgery and the pain medications, it is common to experience some constipation.  Increasing fluid intake and taking a fiber supplement (such as Metamucil, Citrucel, FiberCon, MiraLax, etc) 1-2 times a day regularly will usually help prevent this problem from occurring.  A mild laxative (prune juice, Milk of Magnesia, MiraLax, etc) should be taken according to package directions if there are no bowel movements after 48 hours.   °5. Watch out for diarrhea.  If you have many loose bowel movements, simplify your diet to bland foods & liquids for a few days.  Stop any stool softeners and decrease your fiber supplement.  Switching to mild anti-diarrheal medications (Kayopectate, Pepto Bismol) can help.  If this worsens or does not improve, please call us. °6. Wash / shower every day.  You may shower over the dressings as they are waterproof.  Continue to shower over incision(s) after the dressing is off. °7. Remove your waterproof bandages 5 days after surgery.  You may leave the incision open to air.  You may replace a dressing/Band-Aid to cover the incision for comfort if you wish.  °8. ACTIVITIES as tolerated:   °a. You may resume regular (light) daily activities beginning the next day--such as daily self-care, walking, climbing stairs--gradually increasing activities as tolerated.  If you can walk 30 minutes without difficulty, it   is safe to try more intense activity such as jogging, treadmill, bicycling, low-impact aerobics, swimming, etc. °b. Save the most intensive and strenuous activity for last such as sit-ups, heavy lifting, contact sports, etc  Refrain from any heavy lifting or straining until you are off narcotics for pain control.   °c. DO NOT PUSH THROUGH PAIN.  Let pain be your guide: If it hurts to do something, don't  do it.  Pain is your body warning you to avoid that activity for another week until the pain goes down. °d. You may drive when you are no longer taking prescription pain medication, you can comfortably wear a seatbelt, and you can safely maneuver your car and apply brakes. °e. You may have sexual intercourse when it is comfortable.  °9. FOLLOW UP in our office °a. Please call [facility] at [phone] to set up an appointment to see your surgeon in the office for a follow-up appointment approximately 2-3 weeks after your surgery. °b. Make sure that you call for this appointment the day you arrive home to insure a convenient appointment time. °10. IF YOU HAVE DISABILITY OR FAMILY LEAVE FORMS, BRING THEM TO THE OFFICE FOR PROCESSING.  DO NOT GIVE THEM TO YOUR DOCTOR. ° ° °WHEN TO CALL US [phone]: °1. Poor pain control °2. Reactions / problems with new medications (rash/itching, nausea, etc)  °3. Fever over 101.5 F (38.5 C) °4. Inability to urinate °5. Nausea and/or vomiting °6. Worsening swelling or bruising °7. Continued bleeding from incision. °8. Increased pain, redness, or drainage from the incision ° ° The clinic staff is available to answer your questions during regular business hours (8:30am-5pm).  Please don’t hesitate to call and ask to speak to one of our nurses for clinical concerns.  ° If you have a medical emergency, go to the nearest emergency room or call 911. ° A surgeon from [facility] is always on call at the hospitals ° ° °[facility] °[address], [address], [place], [place]  [address] ? °MAIN: [phone] ? TOLL FREE: [phone] ?  °FAX [phone] °[email or web address] ° °GETTING TO GOOD BOWEL HEALTH. °Irregular bowel habits such as constipation and diarrhea can lead to many problems over time.  Having one soft bowel movement a day is the most important way to prevent further problems.  The anorectal canal is designed to handle  stretching and feces to safely manage our ability to get rid of solid waste (feces, poop, stool) out of our body.  BUT, hard constipated stools can act like ripping concrete bricks and diarrhea can be a burning fire to this very sensitive area of our body, causing inflamed hemorrhoids, anal fissures, increasing risk is perirectal abscesses, abdominal pain/bloating, an making irritable bowel worse.     ° °The goal: ONE SOFT BOWEL MOVEMENT A DAY!  To have soft, regular bowel movements:  °• Drink plenty of fluids, consider 4-6 tall glasses of water a day.   °• Take plenty of fiber.  Fiber is the undigested part of plant food that passes into the colon, acting s “natures broom” to encourage bowel motility and movement.  Fiber can absorb and hold large amounts of water. This results in a larger, bulkier stool, which is soft and easier to pass. Work gradually over several weeks up to 6 servings a day of fiber (25g a day even more if needed) in the form of: °o Vegetables -- Root (potatoes, carrots, turnips), leafy green (lettuce, salad greens,   celery, spinach), or cooked high residue (cabbage, broccoli, etc) °o Fruit -- Fresh (unpeeled skin & pulp), Dried (prunes, apricots, cherries, etc ),  or stewed ( applesauce)  °o Whole grain breads, pasta, etc (whole wheat)  °o Bran cereals  °• Bulking Agents -- This type of water-retaining fiber generally is easily obtained each day by one of the following:  °o Psyllium bran -- The psyllium plant is remarkable because its ground seeds can retain so much water. This product is available as Metamucil, Konsyl, Effersyllium, Per Diem Fiber, or the less expensive generic preparation in drug and health food stores. Although labeled a laxative, it really is not a laxative.  °o Methylcellulose -- This is another fiber derived from wood which also retains water. It is available as Citrucel. °o Polyethylene Glycol - and “artificial” fiber commonly called Miralax or Glycolax.  It is helpful  for people with gassy or bloated feelings with regular fiber °o Flax Seed - a less gassy fiber than psyllium °• No reading or other relaxing activity while on the toilet. If bowel movements take longer than 5 minutes, you are too constipated °• AVOID CONSTIPATION.  High fiber and water intake usually takes care of this.  Sometimes a laxative is needed to stimulate more frequent bowel movements, but  °• Laxatives are not a good long-term solution as it can wear the colon out.  They can help jump-start bowels if constipated, but should be relied on constantly without discussing with your doctor °o Osmotics (Milk of Magnesia, Fleets phosphosoda, Magnesium citrate, MiraLax, GoLytely) are safer than  °o Stimulants (Senokot, Castor Oil, Dulcolax, Ex Lax)    °o Avoid taking laxatives for more than 7 days in a row. °•  IF SEVERELY CONSTIPATED, try a Bowel Retraining Program: °o Do not use laxatives.  °o Eat a diet high in roughage, such as bran cereals and leafy vegetables.  °o Drink six (6) ounces of prune or apricot juice each morning.  °o Eat two (2) large servings of stewed fruit each day.  °o Take one (1) heaping tablespoon of a psyllium-based bulking agent twice a day. Use sugar-free sweetener when possible to avoid excessive calories.  °o Eat a normal breakfast.  °o Set aside 15 minutes after breakfast to sit on the toilet, but do not strain to have a bowel movement.  °o If you do not have a bowel movement by the third day, use an enema and repeat the above steps.  °• Controlling diarrhea °o Switch to liquids and simpler foods for a few days to avoid stressing your intestines further. °o Avoid dairy products (especially milk & ice cream) for a short time.  The intestines often can lose the ability to digest lactose when stressed. °o Avoid foods that cause gassiness or bloating.  Typical foods include beans and other legumes, cabbage, broccoli, and dairy foods.  Every person has some sensitivity to other foods, so  listen to our body and avoid those foods that trigger problems for you. °o Adding fiber (Citrucel, Metamucil, psyllium, Miralax) gradually can help thicken stools by absorbing excess fluid and retrain the intestines to act more normally.  Slowly increase the dose over a few weeks.  Too much fiber too soon can backfire and cause cramping & bloating. °o Probiotics (such as active yogurt, Align, etc) may help repopulate the intestines and colon with normal bacteria and calm down a sensitive digestive tract.  Most studies show it to be of mild help, though, and such products   can be costly. o Medicines: - Bismuth subsalicylate (ex. Kayopectate, Pepto Bismol) every 30 minutes for up to 6 doses can help control diarrhea.  Avoid if pregnant. - Loperamide (Immodium) can slow down diarrhea.  Start with two tablets (35m total) first and then try one tablet every 6 hours.  Avoid if you are having fevers or severe pain.  If you are not better or start feeling worse, stop all medicines and call your doctor for advice o Call your doctor if you are getting worse or not better.  Sometimes further testing (cultures, endoscopy, X-ray studies, bloodwork, etc) may be needed to help diagnose and treat the cause of the diarrhea.  TROUBLESHOOTING IRREGULAR BOWELS 1) Avoid extremes of bowel movements (no bad constipation/diarrhea) 2) Miralax 17gm mixed in 8oz. water or juice-daily. May use BID as needed.  3) Gas-x,Phazyme, etc. as needed for gas & bloating.  4) Soft,bland diet. No spicy,greasy,fried foods.  5) Prilosec over-the-counter as needed  6) May hold gluten/wheat products from diet to see if symptoms improve.  7)  May try probiotics (Align, Activa, etc) to help calm the bowels down 7) If symptoms become worse call back immediately.  Managing Pain  Pain after surgery or related to activity is often due to strain/injury to muscle, tendon, nerves and/or incisions.  This pain is usually short-term and will improve in a  few months.   Many people find it helpful to do the following things TOGETHER to help speed the process of healing and to get back to regular activity more quickly:  1. Avoid heavy physical activity at first a. No lifting greater than 20 pounds at first, then increase to lifting as tolerated over the next few weeks b. Do not push through the pain.  Listen to your body and avoid positions and maneuvers than reproduce the pain.  Wait a few days before trying something more intense c. Walking is okay as tolerated, but go slowly and stop when getting sore.  If you can walk 30 minutes without stopping or pain, you can try more intense activity (running, jogging, aerobics, cycling, swimming, treadmill, sex, sports, weightlifting, etc ) d. Remember: If it hurts to do it, then dont do it!  2. Take Anti-inflammatory medication a. Choose ONE of the following over-the-counter medications: i.            Acetaminophen 5046mtabs (Tylenol) 1-2 pills with every meal and just before bedtime (avoid if you have liver problems) ii.            Naproxen 22053mabs (ex. Aleve) 1-2 pills twice a day (avoid if you have kidney, stomach, IBD, or bleeding problems) iii. Ibuprofen 200m40mbs (ex. Advil, Motrin) 3-4 pills with every meal and just before bedtime (avoid if you have kidney, stomach, IBD, or bleeding problems) b. Take with food/snack around the clock for 1-2 weeks i. This helps the muscle and nerve tissues become less irritable and calm down faster  3. Use a Heating pad or Ice/Cold Pack a. 4-6 times a day b. May use warm bath/hottub  or showers  4. Try Gentle Massage and/or Stretching  a. at the area of pain many times a day b. stop if you feel pain - do not overdo it  Try these steps together to help you body heal faster and avoid making things get worse.  Doing just one of these things may not be enough.    If you are not getting better after two weeks or are noticing  you are getting worse, contact  our office for further advice; we may need to re-evaluate you & see what other things we can do to help.   General Anesthesia, Adult, Care After Refer to this sheet in the next few weeks. These instructions provide you with information on caring for yourself after your procedure. Your health care provider may also give you more specific instructions. Your treatment has been planned according to current medical practices, but problems sometimes occur. Call your health care provider if you have any problems or questions after your procedure. WHAT TO EXPECT AFTER THE PROCEDURE After the procedure, it is typical to experience:  Sleepiness.  Nausea and vomiting. HOME CARE INSTRUCTIONS  For the first 24 hours after general anesthesia:  Have a responsible person with you.  Do not drive a car. If you are alone, do not take public transportation.  Do not drink alcohol.  Do not take medicine that has not been prescribed by your health care provider.  Do not sign important papers or make important decisions.  You may resume a normal diet and activities as directed by your health care provider.  Change bandages (dressings) as directed.  If you have questions or problems that seem related to general anesthesia, call the hospital and ask for the anesthetist or anesthesiologist on call. SEEK MEDICAL CARE IF:  You have nausea and vomiting that continue the day after anesthesia.  You develop a rash. SEEK IMMEDIATE MEDICAL CARE IF:   You have difficulty breathing.  You have chest pain.  You have any allergic problems.   This information is not intended to replace advice given to you by your health care provider. Make sure you discuss any questions you have with your health care provider.   Document Released: 10/28/2000 Document Revised: 08/12/2014 Document Reviewed: 11/20/2011 Elsevier Interactive Patient Education [employer].

## 2015-12-29 NOTE — Anesthesia Postprocedure Evaluation (Signed)
Anesthesia Post Note  Patient: Rebecca Rice  Procedure(s) Performed: Procedure(s) (LRB): LAPAROSCOPIC CHOLECYSTECTOMY SINGLE SITE WITH INTRA OP CHOLANGIOGRAM  (N/A) NEEDLE CORE LIVER BIOPSY (N/A)  Patient location during evaluation: PACU Anesthesia Type: General Level of consciousness: awake and alert Pain management: pain level controlled Vital Signs Assessment: post-procedure vital signs reviewed and stable Respiratory status: spontaneous breathing, nonlabored ventilation, respiratory function stable and patient connected to nasal cannula oxygen Cardiovascular status: blood pressure returned to baseline and stable Postop Assessment: no signs of nausea or vomiting Anesthetic complications: no    Last Vitals:  Filed Vitals:   12/29/15 1037 12/29/15 1220  BP: 122/76 147/81  Pulse:    Temp: 36.7 C   Resp: 18 18    Last Pain:  Filed Vitals:   12/29/15 1221  PainSc: 5                  Dmiya Malphrus J

## 2015-12-29 NOTE — Interval H&P Note (Signed)
History and Physical Interval Note:  12/29/2015 7:29 AM  Rebecca Rice  has presented today for surgery, with the diagnosis of Chronic cholecystitis with gallstones. Possible steatohepatitis  The various methods of treatment have been discussed with the patient and family. After consideration of risks, benefits and other options for treatment, the patient has consented to  Procedure(s): [place]  (N/A) NEEDLE CORE LIVER BIOPSY (N/A) as a surgical intervention .  The patient's history has been reviewed, patient examined, no change in status, stable for surgery.  I have reviewed the patient's chart and labs.  Questions were answered to the patient's satisfaction.     [name]

## 2015-12-29 NOTE — H&P (View-Only) (Signed)
Rebecca Rice 12/05/2015 8:38 AM Location: [facility] Patient #: [id number] DOB: Feb 07, 1971 Single / Language: Rebecca Rice / Race: Black or African American Female  History of Present Illness Adin Hector MD; 12/05/2015 9:18 AM) Patient words: gallstones.  The patient is a 45 year old female who presents for evaluation of gallbladder disease. Note for "Gallbladder disease": Patient sent for surgical consultation at the request of her primary care physician, Dr. Jilda Panda, for concerns of a symptomatic gallbladder.  Pleasant obese female. History of seasonal allergies and high blood pressure. She recalls being told that she had fatty liver. Mildly elevated liver function tests for a while. See. Nonetheless, she had an episode of chronic cough and chest discomfort was diagnosed with a pneumonia. Treated with azithromycin. Gradually improved. However she kept having persistent epigastric episodes of discomfort. She would get episodes of sharp right upper quadrant and epigastric pain. Usually triggered by fatty foods or heavy eating. Would get nauseated. The attacks are happening more often. Now she is vomiting. She's had mild heartburn issues in the past but usually treated with over-the-counter medications. Nothing new with that. This does not feel like that. No sick contacts or travel history. She usually has a bowel movement every day. She can walk a couple miles without difficulty and tries to stay active. She does not smoke. No diabetes.  Given the more classic story of gallbladder attacks ultrasound was ordered. No fatty changes of liver cirrhosis. Obvious gallstones. No cholecystitis. Surgical consultation requested. No personal nor family history of GI/colon cancer, inflammatory bowel disease, irritable bowel syndrome, allergy such as Celiac Sprue, dietary/dairy problems, colitis, ulcers nor gastritis. No recent sick contacts/gastroenteritis. No travel  outside the country. No changes in diet. No dysphagia to solids or liquids. No significant heartburn or reflux. No hematochezia, hematemesis, coffee ground emesis. No evidence of prior gastric/peptic ulceration.   Diagnostic Studies History Marjean Donna, CMA; 12/05/2015 8:38 AM) Colonoscopy never  Allergies (Sonya Bynum, CMA; 12/05/2015 8:40 AM) Latex Exam Gloves *MEDICAL DEVICES AND SUPPLIES* Vicodin *ANALGESICS - OPIOID* Adhesive Remover Wipes *MEDICAL DEVICES AND SUPPLIES*  Medication History Marjean Donna, CMA; 12/05/2015 8:43 AM) Albuterol (90MCG/ACT Aerosol Soln, Inhalation) Active. ZyrTEC Allergy (10MG  Capsule, Oral) Active. KlonoPIN (1MG  Tablet, Oral) Active. Catapres (0.1MG  Tablet, Oral) Active. Losartan Potassium-HCTZ (100-25MG  Tablet, Oral) Active. Latuda (40MG  Tablet, Oral) Active. Invokamet XR (150-1000MG  Tablet ER 24HR, Oral) Active. Asmanex HFA (200MCG/ACT Aerosol, Inhalation) Active. Medications Reconciled     Review of Systems ([place]; 12/05/2015 8:38 AM) Skin Present- Dryness. Not Present- Change in Wart/Mole, Hives, Jaundice, New Lesions, Non-Healing Wounds, Rash and Ulcer. HEENT Present- Sinus Pain. Not Present- Earache, Hearing Loss, Hoarseness, Nose Bleed, Oral Ulcers, Ringing in the Ears, Seasonal Allergies, Sore Throat, Visual Disturbances, Wears glasses/contact lenses and Yellow Eyes. Cardiovascular Present- Leg Cramps. Not Present- Chest Pain, Difficulty Breathing Lying Down, Palpitations, Rapid Heart Rate, Shortness of Breath and Swelling of Extremities.  Vitals (Sonya Bynum CMA; 12/05/2015 8:39 AM) 12/05/2015 8:39 AM Weight: 203 lb Height: 62in Body Surface Area: 1.92 m Body Mass Index: 37.13 kg/m  Temp.: 97.52F(Temporal)  Pulse: 76 (Regular)  BP: 130/80 (Sitting, Left Arm, Standard)      Physical Exam Adin Hector MD; 12/05/2015 9:07 AM)  General Mental Status-Alert. General Appearance-Not in acute distress,  Not Sickly. Orientation-Oriented X3. Hydration-Well hydrated. Voice-Normal.  Integumentary Global Assessment Upon inspection and palpation of skin surfaces of the - Axillae: non-tender, no inflammation or ulceration, no drainage. and Distribution of scalp and body hair  is normal. General Characteristics Temperature - normal warmth is noted.  Head and Neck Head-normocephalic, atraumatic with no lesions or palpable masses. Face Global Assessment - atraumatic, no absence of expression. Neck Global Assessment - no abnormal movements, no bruit auscultated on the right, no bruit auscultated on the left, no decreased range of motion, non-tender. Trachea-midline. Thyroid Gland Characteristics - non-tender.  Eye Eyeball - Left-Extraocular movements intact, No Nystagmus. Eyeball - Right-Extraocular movements intact, No Nystagmus. Cornea - Left-No Hazy. Cornea - Right-No Hazy. Sclera/Conjunctiva - Left-No scleral icterus, No Discharge. Sclera/Conjunctiva - Right-No scleral icterus, No Discharge. Pupil - Left-Direct reaction to light normal. Pupil - Right-Direct reaction to light normal.  ENMT Ears Pinna - Left - no drainage observed, no generalized tenderness observed. Right - no drainage observed, no generalized tenderness observed. Nose and Sinuses External Inspection of the Nose - no destructive lesion observed. Inspection of the nares - Left - quiet respiration. Right - quiet respiration. Mouth and Throat Lips - Upper Lip - no fissures observed, no pallor noted. Lower Lip - no fissures observed, no pallor noted. Nasopharynx - no discharge present. Oral Cavity/Oropharynx - Tongue - no dryness observed. Oral Mucosa - no cyanosis observed. Hypopharynx - no evidence of airway distress observed.  Chest and Lung Exam Inspection Movements - Normal and Symmetrical. Accessory muscles - No use of accessory muscles in breathing. Palpation Palpation of the chest  reveals - Non-tender. Auscultation Breath sounds - Normal and Clear.  Cardiovascular Auscultation Rhythm - Regular. Murmurs & Other Heart Sounds - Auscultation of the heart reveals - No Murmurs and No Systolic Clicks.  Abdomen Inspection Inspection of the abdomen reveals - No Visible peristalsis and No Abnormal pulsations. Umbilicus - No Bleeding, No Urine drainage. Palpation/Percussion Palpation and Percussion of the abdomen reveal - Soft, Non Tender, No Rebound tenderness, No Rigidity (guarding) and No Cutaneous hyperesthesia. Note: Obese but soft. Discomfort right upper quadrant greater than epigastric region. No Murphy sign. No hernias. No umbilical hernia. No diastases. No peritonitis. No rebound tenderness.  Female Genitourinary Sexual Maturity Tanner 5 - Adult hair pattern. Note: No vaginal bleeding nor discharge. No irritation on mons pubis. No inguinal hernias.  Peripheral Vascular Upper Extremity Inspection - Left - No Cyanotic nailbeds, Not Ischemic. Right - No Cyanotic nailbeds, Not Ischemic.  Neurologic Neurologic evaluation reveals -normal attention span and ability to concentrate, able to name objects and repeat phrases. Appropriate fund of knowledge , normal sensation and normal coordination. Mental Status Affect - not angry, not paranoid. Cranial Nerves-Normal Bilaterally. Gait-Normal.  Neuropsychiatric Mental status exam performed with findings of-able to articulate well with normal speech/language, rate, volume and coherence, thought content normal with ability to perform basic computations and apply abstract reasoning and no evidence of hallucinations, delusions, obsessions or homicidal/suicidal ideation.  Musculoskeletal Global Assessment Spine, Ribs and Pelvis - no instability, subluxation or laxity. Right Upper Extremity - no instability, subluxation or laxity.  Lymphatic Head & Neck  General Head & Neck Lymphatics: Bilateral - Description -  No Localized lymphadenopathy. Axillary  General Axillary Region: Bilateral - Description - No Localized lymphadenopathy. Femoral & Inguinal  Generalized Femoral & Inguinal Lymphatics: Left - Description - No Localized lymphadenopathy. Right - Description - No Localized lymphadenopathy.    Assessment & Plan (Janny Crute C. Storie Heffern MD; 12/05/2015 9:15 AM)  CHRONIC CHOLECYSTITIS WITH CALCULUS (K80.10) Impression: Very classic story of biliary colic attacks with now more chronic soreness and in increasing frequency and intensity of attacks. I think she would benefit from cholecystectomy. Rest   of the differential diagnosis seems on likely. She agrees to proceed.  Current Plans You are being scheduled for surgery - Our schedulers will call you.  You should hear from our office's scheduling department within 5 working days about the location, date, and time of surgery. We try to make accommodations for patient's preferences in scheduling surgery, but sometimes the OR schedule or the surgeon's schedule prevents [place] from making those accommodations.  If you have not heard from our office [phone]) in 5 working days, call the office and ask for your surgeon's nurse.  If you have other questions about your diagnosis, plan, or surgery, call the office and ask for your surgeon's nurse.  The anatomy & physiology of hepatobiliary & pancreatic function was discussed. The pathophysiology of gallbladder dysfunction was discussed. Natural history risks without surgery was discussed. I feel the risks of no intervention will lead to serious problems that outweigh the operative risks; therefore, I recommended cholecystectomy to remove the pathology. I explained laparoscopic techniques with possible need for an open approach. Probable cholangiogram to evaluate the bilary tract was explained as well.  Risks such as bleeding, infection, abscess, leak, injury to other organs, need for further treatment, heart attack,  death, and other risks were discussed. I noted a good likelihood this will help address the problem. Possibility that this will not correct all abdominal symptoms was explained. Goals of post-operative recovery were discussed as well. We will work to minimize complications. An educational handout further explaining the pathology and treatment options was given as well. Questions were answered. The patient expresses understanding & wishes to proceed with surgery.  Pt Education - Pamphlet Given - Laparoscopic Gallbladder Surgery: discussed with patient and provided information. Written instructions provided Pt Education - [facility] Laparosopic Post Op HCI (Zabdi Mis) Pt Education - [place] (Kaisyn Millea) Pt Education - Laparoscopic Cholecystectomy: gallbladder HEPATITIS (K75.9) Impression: Mildly elevated liver function tests.  Probably related to her very symptomatic cholecystolithiasis and chronic cholecystitis. She's been told she has fatty change in the liver or some chronic hepatitis issues which concerned her. No history of viral exposure. Offer to do a core liver biopsy at the same time to confirm or dissuade the suspicion  Adin Hector, M.D., F.A.C.S. Gastrointestinal and Minimally Invasive Surgery [facility] [address] [place], [place] [address] [phone] Main / Paging

## 2015-12-29 NOTE — Transfer of Care (Signed)
Immediate Anesthesia Transfer of Care Note  Patient: Rebecca Rice  Procedure(s) Performed: Procedure(s): LAPAROSCOPIC CHOLECYSTECTOMY SINGLE SITE WITH INTRA OP CHOLANGIOGRAM  (N/A) NEEDLE CORE LIVER BIOPSY (N/A)  Patient Location: PACU  Anesthesia Type:General  Level of Consciousness: awake, oriented, patient cooperative, lethargic and responds to stimulation  Airway & Oxygen Therapy: Patient Spontanous Breathing and Patient connected to face mask oxygen  Post-op Assessment: Report given to RN, Post -op Vital signs reviewed and stable and Patient moving all extremities  Post vital signs: Reviewed and stable  Last Vitals:  Filed Vitals:   12/29/15 0557 12/29/15 0945  BP: 114/73 130/63  Pulse: 90 91  Temp: 37.1 C 36.7 C  Resp: 16 13    Last Pain: There were no vitals filed for this visit.       Complications: No apparent anesthesia complications

## 2015-12-29 NOTE — Anesthesia Preprocedure Evaluation (Addendum)
Anesthesia Evaluation  Patient identified by MRN, date of birth, ID band Patient awake    Reviewed: Allergy & Precautions, NPO status , Patient's Chart, lab work & pertinent test results  Airway Mallampati: II  TM Distance: >3 FB Neck ROM: Full    Dental no notable dental hx.    Pulmonary asthma ,    Pulmonary exam normal breath sounds clear to auscultation       Cardiovascular hypertension, Pt. on medications Normal cardiovascular exam Rhythm:Regular Rate:Normal     Neuro/Psych PSYCHIATRIC DISORDERS Anxiety Bipolar Disorder TIA   GI/Hepatic GERD  ,H/O elevated liver enzymes since age 45   Endo/Other  diabetes, Type 1, Insulin Dependent  Renal/GU negative Renal ROS  negative genitourinary   Musculoskeletal  (+) Arthritis ,   Abdominal (+) + obese,   Peds negative pediatric ROS (+)  Hematology negative hematology ROS (+)   Anesthesia Other Findings   Reproductive/Obstetrics negative OB ROS                            Anesthesia Physical Anesthesia Plan  ASA: III  Anesthesia Plan: General   Post-op Pain Management:    Induction: Intravenous  Airway Management Planned:   Additional Equipment:   Intra-op Plan:   Post-operative Plan: Extubation in OR  Informed Consent: I have reviewed the patients History and Physical, chart, labs and discussed the procedure including the risks, benefits and alternatives for the proposed anesthesia with the patient or authorized representative who has indicated his/her understanding and acceptance.   Dental advisory given  Plan Discussed with: CRNA  Anesthesia Plan Comments:         Anesthesia Quick Evaluation

## 2015-12-29 NOTE — Op Note (Addendum)
12/29/2015  9:32 AM  PATIENT:  Rebecca Rice  45 y.o. female  Patient Care Team: Jilda Panda, MD as PCP - General (Internal Medicine)  PRE-OPERATIVE DIAGNOSIS:  Chronic cholecystitis with gallstones. Possible steatohepatitis  POST-OPERATIVE DIAGNOSIS:    Chronic cholecystitis with gallstones.  Steatohepatitis, possible cirrhosis  PROCEDURE:  Procedure(s): LAPAROSCOPIC CHOLECYSTECTOMY SINGLE SITE WITH INTRA OP CHOLANGIOGRAM  NEEDLE CORE LIVER BIOPSY  SURGEON:  Surgeon(s): Michael Boston, MD  ASSISTANT: none   ANESTHESIA:   local and general  EBL:     Delay start of Pharmacological VTE agent (>24hrs) due to surgical blood loss or risk of bleeding:  no  DRAINS:  none   SPECIMEN:  Source of Specimen:   Gallbladder   DISPOSITION OF SPECIMEN:  PATHOLOGY  COUNTS:  YES  PLAN OF CARE: Discharge to home after PACU  PATIENT DISPOSITION:  PACU - hemodynamically stable.  INDICATION: Pleasant obese female with classic story of biliary colic.  Also probable fatty change in the liver with elevated transaminases.  I recommended cholecystectomy and liver biopsy.  The anatomy & physiology of hepatobiliary & pancreatic function was discussed.  The pathophysiology of gallbladder dysfunction was discussed.  Natural history risks without surgery was discussed.   I feel the risks of no intervention will lead to serious problems that outweigh the operative risks; therefore, I recommended cholecystectomy to remove the pathology.  I explained laparoscopic techniques with possible need for an open approach.  Probable cholangiogram to evaluate the bilary tract was explained as well.    Risks such as bleeding, infection, abscess, leak, injury to other organs, need for further treatment, heart attack, death, and other risks were discussed.  I noted a good likelihood this will help address the problem.  Possibility that this will not correct all abdominal symptoms was explained.  Goals of  post-operative recovery were discussed as well.  We will work to minimize complications.  An educational handout further explaining the pathology and treatment options was given as well.  Questions were answered.  The patient expresses understanding & wishes to proceed with surgery.   OR FINDINGS: Mildly thickened gallbladder with large stones.  Very intrahepatic gallbladder with enlarged liver.  At least fatty change.  Suspicious for perhaps early micronodular cirrhosis  DESCRIPTION:   The patient was identified & brought in the operating room. The patient was positioned supine with arms tucked. SCDs were active during the entire case. The patient underwent general anesthesia without any difficulty.  The abdomen was prepped and draped in a sterile fashion. A Surgical Timeout confirmed our plan.  I made a transverse curvilinear incision through the superior umbilical fold.  I placed a 73mm long port through the supraumbilical fascia using a modified Hassan cutdown technique. I began carbon dioxide insufflation. Camera inspection revealed no injury. There were no adhesions to the anterior abdominal wall supraumbilically.  I proceeded to continue with single site technique. I placed a #5 port in left upper aspect of the wound. I placed a 5 mm atraumatic grasper in the right inferior aspect of the wound.  I turned attention to the right upper quadrant.  It was quite intrahepatic with a very thickened.  Liver fatty change of the liver.  Some fine scalloping suspicious for micronodular cirrhosis.  The gallbladder fundus was elevated cephalad. I freed the peritoneal coverings between the gallbladder and the liver on the posteriolateral and anteriomedial walls. I alternated between Harmonic & blunt Maryland dissection to help get a good critical view of the cystic  artery and cystic duct. I did further dissection to free a few centimeters of the  gallbladder off the liver bed to get a good critical view of the  infundibulum and cystic duct. I mobilized the cystic artery; and, after getting a good 360 view, ligated the cystic artery using the Harmonic ultrasonic dissection. I skeletonized the cystic duct.  I placed a clip on the infundibulum. I did a partial cystic duct-otomy and ensured patency. I placed a 5 [place] cholangiocatheter through a puncture site at the right subcostal ridge of the abdominal wall and directed it into the cystic duct.  We ran a cholangiogram with dilute radio-opaque contrast and continuous fluoroscopy.  Contrast flowed from a side branch consistent with cystic duct cannulization. Contrast flowed up the common hepatic duct into the right and left intrahepatic chains out to secondary radicals. Contrast flowed down the common bile duct easily across the normal ampulla into the duodenum.  This was consistent with a normal cholangiogram.  I removed the cholangiocatheter. I placed clips on the cystic duct x4.  I completed cystic duct transection. I freed the gallbladder from its remaining attachments to the liver. I ensured hemostasis on the gallbladder fossa of the liver and elsewhere. I inspected the rest of the abdomen & detected no injury nor bleeding elsewhere.  I did 14G Trucut core liver biopsies through the anterior surface of the right hepatic lobe.  2 good & 1 decent core. I ensured hemostasis.  I removed the gallbladder out the supraumbilical fascia. I closed the fascia transversely using  #1 PDS interrupted stitches. I closed the skin using 4-0 monocryl stitch.  Sterile dressing was applied. The patient was extubated & arrived in the PACU in stable condition..  I had discussed postoperative care with the patient in the holding area. I updated the patient's status to the patient's family.  Recommendations were made.  Questions were answered.  They expressed understanding & appreciation.  Instructions are written in the chart as well.  Adin Hector, M.D.,  F.A.C.S. Gastrointestinal and Minimally Invasive Surgery [facility] [address] [place], [place] [address] [phone] Main / Paging

## 2015-12-29 NOTE — Anesthesia Procedure Notes (Signed)
Procedure Name: Intubation Date/Time: 12/29/2015 7:46 AM Performed by: Ofilia Neas Pre-anesthesia Checklist: Patient identified, Emergency Drugs available, Suction available and Patient being monitored Patient Re-evaluated:Patient Re-evaluated prior to inductionOxygen Delivery Method: Circle System Utilized Preoxygenation: Pre-oxygenation with 100% oxygen Intubation Type: IV induction Ventilation: Mask ventilation without difficulty and Oral airway inserted - appropriate to patient size Laryngoscope Size: Mac and 3 Grade View: Grade II Tube type: Oral Tube size: 7.0 mm Number of attempts: 1 Airway Equipment and Method: Stylet and Oral airway Placement Confirmation: ETT inserted through vocal cords under direct vision,  positive ETCO2 and breath sounds checked- equal and bilateral Secured at: 22 cm Tube secured with: Tape Dental Injury: Teeth and Oropharynx as per pre-operative assessment

## 2015-12-29 NOTE — Progress Notes (Signed)
Patient ambulated from 1301 to 1310 and tolerated it very well. Transitioned from stretcher to sitting to standing very well. To bathroom to void a large amount of amber urine. States pain is a 7 ; however, she is conversing with family and moving with out guarding or facial expressions.

## 2016-01-26 ENCOUNTER — Encounter: Payer: Self-pay | Admitting: Genetic Counselor

## 2017-06-30 ENCOUNTER — Other Ambulatory Visit (HOSPITAL_COMMUNITY)
Admission: RE | Admit: 2017-06-30 | Discharge: 2017-06-30 | Disposition: A | Discharge status: Home / Self Care | Payer: BLUE CROSS/BLUE SHIELD | Source: Ambulatory Visit | Attending: Obstetrics and Gynecology | Admitting: Obstetrics and Gynecology

## 2017-06-30 ENCOUNTER — Other Ambulatory Visit: Payer: Self-pay | Admitting: Obstetrics and Gynecology

## 2017-06-30 DIAGNOSIS — Z124 Encounter for screening for malignant neoplasm of cervix: Secondary | ICD-10-CM | POA: Diagnosis not present

## 2017-07-02 LAB — CYTOLOGY - PAP
DIAGNOSIS: NEGATIVE
HPV (WINDOPATH): NOT DETECTED

## 2017-09-08 ENCOUNTER — Ambulatory Visit (INDEPENDENT_AMBULATORY_CARE_PROVIDER_SITE_OTHER): Payer: BLUE CROSS/BLUE SHIELD

## 2017-09-08 ENCOUNTER — Encounter (INDEPENDENT_AMBULATORY_CARE_PROVIDER_SITE_OTHER): Payer: Self-pay | Admitting: Orthopedic Surgery

## 2017-09-08 ENCOUNTER — Ambulatory Visit (INDEPENDENT_AMBULATORY_CARE_PROVIDER_SITE_OTHER): Payer: BLUE CROSS/BLUE SHIELD | Admitting: Orthopedic Surgery

## 2017-09-08 DIAGNOSIS — M25552 Pain in left hip: Secondary | ICD-10-CM | POA: Diagnosis not present

## 2017-09-08 DIAGNOSIS — G8929 Other chronic pain: Secondary | ICD-10-CM | POA: Diagnosis not present

## 2017-09-08 DIAGNOSIS — M5442 Lumbago with sciatica, left side: Secondary | ICD-10-CM
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
## 2017-09-18 ENCOUNTER — Ambulatory Visit
Admission: RE | Admit: 2017-09-18 | Discharge: 2017-09-18 | Disposition: A | Discharge status: Home / Self Care | Payer: BLUE CROSS/BLUE SHIELD | Source: Ambulatory Visit | Attending: Orthopedic Surgery | Admitting: Orthopedic Surgery

## 2017-09-18 DIAGNOSIS — G8929 Other chronic pain: Secondary | ICD-10-CM

## 2017-09-18 DIAGNOSIS — M5442 Lumbago with sciatica, left side: Principal | ICD-10-CM

## 2017-09-22 ENCOUNTER — Encounter (INDEPENDENT_AMBULATORY_CARE_PROVIDER_SITE_OTHER): Payer: Self-pay | Admitting: Orthopedic Surgery

## 2017-09-22 ENCOUNTER — Ambulatory Visit (INDEPENDENT_AMBULATORY_CARE_PROVIDER_SITE_OTHER): Payer: BLUE CROSS/BLUE SHIELD | Admitting: Orthopedic Surgery

## 2017-09-22 DIAGNOSIS — M545 Low back pain: Secondary | ICD-10-CM | POA: Diagnosis not present

## 2017-09-22 MED ORDER — METHOCARBAMOL 500 MG PO TABS: 500.0000 mg | ORAL_TABLET | Freq: Two times a day (BID) | ORAL | 0 refills | Status: DC | PRN

## 2017-09-23 ENCOUNTER — Telehealth (INDEPENDENT_AMBULATORY_CARE_PROVIDER_SITE_OTHER): Payer: Self-pay | Admitting: Orthopedic Surgery

## 2017-09-23 NOTE — Telephone Encounter (Signed)
Patient states she has been trying to contact Dr. Ernestina Patches to be scheduled

## 2017-09-23 NOTE — Telephone Encounter (Signed)
Disregard, she was asking about being referred to another doctor to have injection but it looks like they've already contacted her.

## 2017-09-24 ENCOUNTER — Encounter (INDEPENDENT_AMBULATORY_CARE_PROVIDER_SITE_OTHER): Payer: Self-pay | Admitting: Orthopedic Surgery

## 2017-09-24 NOTE — Progress Notes (Signed)
Office Visit Note   Patient: Rebecca Rice           Date of Birth: June 12, 1971           MRN: [id number] Visit Date: 09/22/2017 Requested by: Jilda Panda, MD [address] [place], [place] [address] PCP: Jilda Panda, MD  Subjective: Chief Complaint  Patient presents with  . Lower Back - Follow-up    HPI: Meko is a patient with low back pain.  Since she was last seen she has had an MRI scan which shows L5-S1 left-sided H&P.  She is having left-sided leg symptoms.'s been going on since October.  She stopped working out.  She has symptoms when she is standing.  She is been taking Aleve.              ROS: All systems reviewed are negative as they relate to the chief complaint within the history of present illness.  Patient denies  fevers or chills.   Assessment & Plan: Visit Diagnoses:  1. Low back pain, unspecified back pain laterality, unspecified chronicity, with sciatica presence unspecified     Plan: Impression is left sided HNP.  Plan is Robaxin as a muscle relaxer and referred to Dr. Ernestina Patches for evaluation and possible injections.  Follow-Up Instructions: No Follow-up on file.   Orders:  Orders Placed This Encounter  Procedures  . Ambulatory referral to Physical Medicine Rehab   Meds ordered this encounter  Medications  . methocarbamol (ROBAXIN) 500 MG tablet    Sig: Take 1 tablet (500 mg total) by mouth 2 (two) times daily as needed for muscle spasms.    Dispense:  30 tablet    Refill:  0      Procedures: No procedures performed   Clinical Data: No additional findings.  Objective: Vital Signs: There were no vitals taken for this visit.  Physical Exam:   Constitutional: Patient appears well-developed HEENT:  Head: Normocephalic Eyes:EOM are normal Neck: Normal range of motion Cardiovascular: Normal rate Pulmonary/chest: Effort normal Neurologic: Patient is alert Skin: Skin is warm Psychiatric: Patient has normal mood and affect    Ortho Exam:  Orthopedic exam demonstrates nerve root tension signs on the left negative on the right.  No groin pain with internal/external rotation of the leg.  Symmetric reflexes.  Palpable pedal pulses.  Some pain with forward and lateral bending.  Mild trochanteric tenderness on the left not on the right.  No muscle atrophy in either leg.  Specialty Comments:  No specialty comments available.  Imaging: No results found.   PMFS History: Patient Active Problem List   Diagnosis Date Noted  . Acquired absence of breast and nipple 10/17/2011  . 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 Anxiety   . Arthritis    knees, ankles, hips  . Asthma    daily and prn inhalers; triggered by environmental allergies  . Bipolar disorder ([place])   . Cancer [facility]) 2012   breast, left   . Diabetes mellitus    NIDDM  . Elevated liver enzymes since age 12  . GERD (gastroesophageal reflux disease)    OTC as needed  . Heart murmur    states no known problems  . Hypertension    under control; has been on med. > 12 yrs.  [name] TIA (transient ischemic attack) age 56   TIA vs. Bell's palsy(was undetermined) - no residual deficits  . Wears glasses     History  reviewed. No pertinent family history.  Past Surgical History:  Procedure Laterality Date  . BREAST CAPSULECTOMY WITH IMPLANT EXCHANGE  06/24/2012   Procedure: BREAST CAPSULECTOMY WITH IMPLANT EXCHANGE;  Surgeon: Theodoro Kos, DO;  Location: [place];  Service: Plastics;  Laterality: Bilateral;  removal of bilateral breast expanders capsulectomies and placement of implants    . BREAST IMPLANT EXCHANGE Bilateral 07/05/2013   Procedure: REMOVAL OF BILATERAL IMPLANTS REPLACEMENT OF BILATERAL IMPLANTS BILATERAL  ;  Surgeon: Cristine Polio, MD;  Location: [place];  Service: Plastics;  Laterality: Bilateral;  . BREAST RECONSTRUCTION  10/17/2011   Procedure: BREAST RECONSTRUCTION;  Surgeon:  Theodoro Kos, DO;  Location: [place];  Service: Plastics;  Laterality: Bilateral;  bilateral breast reconstruction with expander and flex hd placement  . BREAST RECONSTRUCTION N/A 04/18/2014   Procedure: LOWER OF RIGHT INFRA MAMMARY FOLD/RIGHT AND LEFT NIPPLE AREOLA COMPLEX RECONSTRUCTION Additional Normal saline added to right breast implant;  Surgeon: Cristine Polio, MD;  Location: [place];  Service: Plastics;  Laterality: N/A;  . FOOT SURGERY Bilateral 01/2010; 1998  . LAPAROSCOPIC CHOLECYSTECTOMY SINGLE SITE WITH INTRAOPERATIVE CHOLANGIOGRAM N/A 12/29/2015   Procedure: LAPAROSCOPIC CHOLECYSTECTOMY SINGLE SITE WITH INTRA OP CHOLANGIOGRAM ;  Surgeon: Michael Boston, MD;  [facility];  Service: General;  Laterality: N/A;  . LASIK Bilateral 2002  . LIVER BIOPSY N/A 12/29/2015   Procedure: NEEDLE CORE LIVER BIOPSY;  Surgeon: Michael Boston, MD;  [facility];  Service: General;  Laterality: N/A;  . MASTECTOMY  11/21/2010   bilat. simple mastect.lt snbx  . MASTOPEXY Bilateral 02/21/2014   Procedure: SCAR REVISION RIGHT AND LEFT BREAST;  Surgeon: Cristine Polio, MD;  Location: [place];  Service: Plastics;  Laterality: Bilateral;  . SCAR REVISION Bilateral 07/05/2013   Procedure: SCAR REVISION BILATERAL EXCISION OF SEVERE EXCESSIVE BREAST TISSUE ;  Surgeon: Cristine Polio, MD;  Location: [place];  Service: Plastics;  Laterality: Bilateral;  . WISDOM TOOTH EXTRACTION  age 35   Social History   Occupational History  . Not on file  Tobacco Use  . Smoking status: Never Smoker  . Smokeless tobacco: Never Used  Substance and Sexual Activity  . Alcohol use: Yes    Comment: rarely  . Drug use: No  . Sexual activity: Not on file

## 2017-09-25 ENCOUNTER — Telehealth (INDEPENDENT_AMBULATORY_CARE_PROVIDER_SITE_OTHER): Payer: Self-pay | Admitting: Orthopedic Surgery

## 2017-09-25 NOTE — Telephone Encounter (Signed)
Patient said she would like to go ahead and have surgery instead of seeing Dr. Ernestina Patches. Please advise # [phone]

## 2017-09-25 NOTE — Telephone Encounter (Signed)
noted 

## 2017-09-25 NOTE — Telephone Encounter (Signed)
I sent you message about this yesterday. Patient called back today so I scheduled her for first avail. You can disregard message from yesterday.

## 2017-10-08 ENCOUNTER — Encounter (INDEPENDENT_AMBULATORY_CARE_PROVIDER_SITE_OTHER): Payer: Self-pay | Admitting: Orthopaedic Surgery

## 2017-10-08 ENCOUNTER — Ambulatory Visit (INDEPENDENT_AMBULATORY_CARE_PROVIDER_SITE_OTHER): Payer: BLUE CROSS/BLUE SHIELD | Admitting: Orthopaedic Surgery

## 2017-10-08 ENCOUNTER — Telehealth (INDEPENDENT_AMBULATORY_CARE_PROVIDER_SITE_OTHER): Payer: Self-pay | Admitting: Orthopaedic Surgery

## 2017-10-08 VITALS — BP 139/92 | HR 69 | Ht 62.0 in | Wt 190.0 lb

## 2017-10-08 DIAGNOSIS — M5126 Other intervertebral disc displacement, lumbar region: Secondary | ICD-10-CM | POA: Diagnosis not present

## 2017-10-08 NOTE — Telephone Encounter (Signed)
Patient is scheduled for Left L5-S1 Microdiscectomy on October 17, 2017 @ [facility].  She is to report to [employer] on the  March 18.  Patient is requesting that Dr. Lorin Mercy provide her with a letter to excuse her. Please mail letter ASAP to home address.      pts cb  [phone]
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
## 2017-10-08 NOTE — Telephone Encounter (Signed)
Ok thanks 
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
## 2017-10-08 NOTE — Telephone Encounter (Signed)
Ok for note 

## 2017-10-09 NOTE — Telephone Encounter (Signed)
Note completed and mailed to patient's home. I left voicemail for patient advising.

## 2017-10-14 NOTE — Pre-Procedure Instructions (Signed)
Rebecca Rice  10/14/2017      [facility] [place] [address] [place] [address] Phone: [phone] Fax: [phone]    Your procedure is scheduled on  Friday 10/17/17  Report to [facility] Admitting at 820 A.M.  Call this number if you have problems the morning of surgery:  [phone]   Remember:  Do not eat food or drink liquids after midnight.  Take these medicines the morning of surgery with A SIP OF WATER -  ALBUTEROL INHALER IF NEEDED (BRING WITH YOU)  7 days prior to surgery STOP taking any Aspirin(unless otherwise instructed by your surgeon), Aleve, Naproxen, Ibuprofen, Motrin, Advil, Goody's, BC's, all herbal medications, fish oil, and all vitamins    How to Manage Your Diabetes Before and After Surgery  Why is it important to control my blood sugar before and after surgery? . Improving blood sugar levels before and after surgery helps healing and can limit problems. . A way of improving blood sugar control is eating a healthy diet by: o  Eating less sugar and carbohydrates o  Increasing activity/exercise o  Talking with your doctor about reaching your blood sugar goals . High blood sugars (greater than 180 mg/dL) can raise your risk of infections and slow your recovery, so you will need to focus on controlling your diabetes during the weeks before surgery. . Make sure that the doctor who takes care of your diabetes knows about your planned surgery including the date and location.  How do I manage my blood sugar before surgery? . Check your blood sugar at least 4 times a day, starting 2 days before surgery, to make sure that the level is not too high or low. o Check your blood sugar the morning of your surgery when you wake up and every 2 hours until you get to the Short Stay unit. . If your blood sugar is less than 70 mg/dL, you will need to treat for low blood sugar: o Do not take  insulin. o Treat a low blood sugar (less than 70 mg/dL) with  cup of clear juice (cranberry or apple), 4 glucose tablets, OR glucose gel. Recheck blood sugar in 15 minutes after treatment (to make sure it is greater than 70 mg/dL). If your blood sugar is not greater than 70 mg/dL on recheck, call [phone] o  for further instructions. . Report your blood sugar to the short stay nurse when you get to Short Stay.  . If you are admitted to the hospital after surgery: o Your blood sugar will be checked by the staff and you will probably be given insulin after surgery (instead of oral diabetes medicines) to make sure you have good blood sugar levels. o The goal for blood sugar control after surgery is 80-180 mg/dL.              WHAT DO I DO ABOUT MY DIABETES MEDICATION?   [name] Do not take oral diabetes medicines (pills) the morning of surgery.  . THE NIGHT BEFORE SURGERY, take _____25______ units of ______TRESIBA_____insulin.       . THE MORNING OF SURGERY, take __NONE___________ units of __________insulin.  . The day of surgery, do not take other diabetes injectables, including Byetta (exenatide), Bydureon (exenatide ER), Victoza (liraglutide), or Trulicity (dulaglutide).  . If your CBG is greater than 220 mg/dL, you may take  of your sliding scale (correction) dose of insulin.  Other Instructions:          Patient Signature:  Date:   Nurse Signature:  Date:   Reviewed and Endorsed by [facility] Patient Education Committee, August 2015  Do not wear jewelry, make-up or nail polish.  Do not wear lotions, powders, or perfumes, or deodorant.  Do not shave 48 hours prior to surgery.  Men may shave face and neck.  Do not bring valuables to the hospital.  [facility] is not responsible for any belongings or valuables.  Contacts, dentures or bridgework may not be worn into surgery.  Leave your suitcase in the car.  After surgery it may be brought to your room.  For  patients admitted to the hospital, discharge time will be determined by your treatment team.  Patients discharged the day of surgery will not be allowed to drive home.   Name and phone number of your driver:    Special instructions:  [place] - Preparing for Surgery  Before surgery, you can play an important role.  Because skin is not sterile, your skin needs to be as free of germs as possible.  You can reduce the number of germs on you skin by washing with CHG (chlorahexidine gluconate) soap before surgery.  CHG is an antiseptic cleaner which kills germs and bonds with the skin to continue killing germs even after washing.  Please DO NOT use if you have an allergy to CHG or antibacterial soaps.  If your skin becomes reddened/irritated stop using the CHG and inform your nurse when you arrive at Short Stay.  Do not shave (including legs and underarms) for at least 48 hours prior to the first CHG shower.  You may shave your face.  Please follow these instructions carefully:   1.  Shower with CHG Soap the night before surgery and the                                morning of Surgery.  2.  If you choose to wash your hair, wash your hair first as usual with your       normal shampoo.  3.  After you shampoo, rinse your hair and body thoroughly to remove the                      Shampoo.  4.  Use CHG as you would any other liquid soap.  You can apply chg directly       to the skin and wash gently with scrungie or a clean washcloth.  5.  Apply the CHG Soap to your body ONLY FROM THE NECK DOWN.        Do not use on open wounds or open sores.  Avoid contact with your eyes,       ears, mouth and genitals (private parts).  Wash genitals (private parts)       with your normal soap.  6.  Wash thoroughly, paying special attention to the area where your surgery        will be performed.  7.  Thoroughly rinse your body with warm water from the neck down.  8.  DO NOT shower/wash with your normal soap after  using and rinsing off       the CHG Soap.  9.  Pat yourself dry with a clean towel.            10.  Wear clean  pajamas.            11.  Place clean sheets on your bed the night of your first shower and do not        sleep with pets.  Day of Surgery  Do not apply any lotions/deoderants the morning of surgery.  Please wear clean clothes to the hospital/surgery center.    Please read over the following fact sheets that you were given. MRSA Information and Surgical Site Infection Prevention

## 2017-10-15 ENCOUNTER — Encounter (HOSPITAL_COMMUNITY)
Admission: RE | Admit: 2017-10-15 | Discharge: 2017-10-15 | Disposition: A | Discharge status: Home / Self Care | Payer: BLUE CROSS/BLUE SHIELD | Source: Ambulatory Visit | Attending: Orthopaedic Surgery | Admitting: Orthopaedic Surgery

## 2017-10-15 ENCOUNTER — Other Ambulatory Visit: Payer: Self-pay

## 2017-10-15 ENCOUNTER — Encounter (HOSPITAL_COMMUNITY): Payer: Self-pay

## 2017-10-15 DIAGNOSIS — F319 Bipolar disorder, unspecified: Secondary | ICD-10-CM | POA: Diagnosis not present

## 2017-10-15 DIAGNOSIS — Z79899 Other long term (current) drug therapy: Secondary | ICD-10-CM | POA: Diagnosis not present

## 2017-10-15 DIAGNOSIS — Z885 Allergy status to narcotic agent status: Secondary | ICD-10-CM | POA: Diagnosis not present

## 2017-10-15 DIAGNOSIS — M5117 Intervertebral disc disorders with radiculopathy, lumbosacral region: Secondary | ICD-10-CM | POA: Diagnosis not present

## 2017-10-15 DIAGNOSIS — I10 Essential (primary) hypertension: Secondary | ICD-10-CM | POA: Diagnosis not present

## 2017-10-15 DIAGNOSIS — F419 Anxiety disorder, unspecified: Secondary | ICD-10-CM | POA: Diagnosis not present

## 2017-10-15 DIAGNOSIS — J45909 Unspecified asthma, uncomplicated: Secondary | ICD-10-CM | POA: Diagnosis not present

## 2017-10-15 DIAGNOSIS — Z888 Allergy status to other drugs, medicaments and biological substances status: Secondary | ICD-10-CM | POA: Diagnosis not present

## 2017-10-15 DIAGNOSIS — Z853 Personal history of malignant neoplasm of breast: Secondary | ICD-10-CM | POA: Diagnosis not present

## 2017-10-15 DIAGNOSIS — Z794 Long term (current) use of insulin: Secondary | ICD-10-CM | POA: Diagnosis not present

## 2017-10-15 DIAGNOSIS — G4730 Sleep apnea, unspecified: Secondary | ICD-10-CM | POA: Diagnosis not present

## 2017-10-15 DIAGNOSIS — Z886 Allergy status to analgesic agent status: Secondary | ICD-10-CM | POA: Diagnosis not present

## 2017-10-15 DIAGNOSIS — E119 Type 2 diabetes mellitus without complications: Secondary | ICD-10-CM | POA: Diagnosis not present

## 2017-10-15 HISTORY — DX: Sleep apnea, unspecified: G47.30

## 2017-10-15 LAB — SURGICAL PCR SCREEN
MRSA, PCR: NEGATIVE
STAPHYLOCOCCUS AUREUS: POSITIVE — AB

## 2017-10-15 LAB — COMPREHENSIVE METABOLIC PANEL
ALBUMIN: 3.6 g/dL (ref 3.5–5.0)
ALK PHOS: 36 U/L — AB (ref 38–126)
ALT: 24 U/L (ref 14–54)
ANION GAP: 9 (ref 5–15)
AST: 30 U/L (ref 15–41)
BILIRUBIN TOTAL: 1.2 mg/dL (ref 0.3–1.2)
BUN: 12 mg/dL (ref 6–20)
CO2: 23 mmol/L (ref 22–32)
Calcium: 9.1 mg/dL (ref 8.9–10.3)
Chloride: 104 mmol/L (ref 101–111)
Creatinine, Ser: 0.91 mg/dL (ref 0.44–1.00)
GFR calc non Af Amer: >60 mL/min (ref >60)
GLUCOSE: 101 mg/dL — AB (ref 65–99)
POTASSIUM: 3.9 mmol/L (ref 3.5–5.1)
Sodium: 136 mmol/L (ref 135–145)
TOTAL PROTEIN: 7.1 g/dL (ref 6.5–8.1)

## 2017-10-15 LAB — CBC
HEMATOCRIT: 38.2 % (ref 36.0–46.0)
Hemoglobin: 12.7 g/dL (ref 12.0–15.0)
MCH: 32.7 pg (ref 26.0–34.0)
MCHC: 33.2 g/dL (ref 30.0–36.0)
MCV: 98.5 fL (ref 78.0–100.0)
Platelets: 234 10*3/uL (ref 150–400)
RBC: 3.88 MIL/uL (ref 3.87–5.11)
RDW: 13.4 % (ref 11.5–15.5)
WBC: 5.4 10*3/uL (ref 4.0–10.5)

## 2017-10-15 LAB — HEMOGLOBIN A1C
HEMOGLOBIN A1C: 4.2 % — AB (ref 4.8–5.6)
MEAN PLASMA GLUCOSE: 73.84 mg/dL

## 2017-10-15 LAB — GLUCOSE, CAPILLARY: Glucose-Capillary: 113 mg/dL — ABNORMAL HIGH (ref 65–99)

## 2017-10-15 NOTE — Progress Notes (Signed)
Requested last EKG and sleep study from DR.Murvin Donning.

## 2017-10-17 ENCOUNTER — Encounter (HOSPITAL_COMMUNITY): Payer: Self-pay | Admitting: Certified Registered"

## 2017-10-17 ENCOUNTER — Ambulatory Visit (HOSPITAL_COMMUNITY): Payer: BLUE CROSS/BLUE SHIELD | Admitting: Certified Registered"

## 2017-10-17 ENCOUNTER — Ambulatory Visit (HOSPITAL_COMMUNITY): Payer: BLUE CROSS/BLUE SHIELD | Admitting: Emergency Medicine

## 2017-10-17 ENCOUNTER — Observation Stay (HOSPITAL_COMMUNITY)
Admission: RE | Admit: 2017-10-17 | Discharge: 2017-10-18 | Disposition: A | Discharge status: Home / Self Care | Payer: BLUE CROSS/BLUE SHIELD | Source: Ambulatory Visit | Attending: Orthopaedic Surgery | Admitting: Orthopaedic Surgery

## 2017-10-17 ENCOUNTER — Encounter (HOSPITAL_COMMUNITY)
Admission: RE | Disposition: A | Discharge status: Home / Self Care | Payer: Self-pay | Source: Ambulatory Visit | Attending: Orthopaedic Surgery

## 2017-10-17 ENCOUNTER — Ambulatory Visit (HOSPITAL_COMMUNITY): Payer: BLUE CROSS/BLUE SHIELD

## 2017-10-17 DIAGNOSIS — G4730 Sleep apnea, unspecified: Secondary | ICD-10-CM | POA: Insufficient documentation

## 2017-10-17 DIAGNOSIS — E119 Type 2 diabetes mellitus without complications: Secondary | ICD-10-CM | POA: Insufficient documentation

## 2017-10-17 DIAGNOSIS — F419 Anxiety disorder, unspecified: Secondary | ICD-10-CM | POA: Insufficient documentation

## 2017-10-17 DIAGNOSIS — Z79899 Other long term (current) drug therapy: Secondary | ICD-10-CM | POA: Insufficient documentation

## 2017-10-17 DIAGNOSIS — Z853 Personal history of malignant neoplasm of breast: Secondary | ICD-10-CM | POA: Insufficient documentation

## 2017-10-17 DIAGNOSIS — Z886 Allergy status to analgesic agent status: Secondary | ICD-10-CM | POA: Insufficient documentation

## 2017-10-17 DIAGNOSIS — J45909 Unspecified asthma, uncomplicated: Secondary | ICD-10-CM | POA: Insufficient documentation

## 2017-10-17 DIAGNOSIS — Z419 Encounter for procedure for purposes other than remedying health state, unspecified: Secondary | ICD-10-CM

## 2017-10-17 DIAGNOSIS — M5117 Intervertebral disc disorders with radiculopathy, lumbosacral region: Secondary | ICD-10-CM | POA: Diagnosis not present

## 2017-10-17 DIAGNOSIS — Z885 Allergy status to narcotic agent status: Secondary | ICD-10-CM | POA: Insufficient documentation

## 2017-10-17 DIAGNOSIS — Z794 Long term (current) use of insulin: Secondary | ICD-10-CM | POA: Insufficient documentation

## 2017-10-17 DIAGNOSIS — M5126 Other intervertebral disc displacement, lumbar region: Secondary | ICD-10-CM | POA: Diagnosis present

## 2017-10-17 DIAGNOSIS — F319 Bipolar disorder, unspecified: Secondary | ICD-10-CM | POA: Insufficient documentation

## 2017-10-17 DIAGNOSIS — Z888 Allergy status to other drugs, medicaments and biological substances status: Secondary | ICD-10-CM | POA: Insufficient documentation

## 2017-10-17 DIAGNOSIS — I10 Essential (primary) hypertension: Secondary | ICD-10-CM | POA: Insufficient documentation

## 2017-10-17 HISTORY — PX: LUMBAR LAMINECTOMY/DECOMPRESSION MICRODISCECTOMY: SHX5026

## 2017-10-17 HISTORY — PX: BACK SURGERY: SHX140

## 2017-10-17 LAB — GLUCOSE, CAPILLARY
GLUCOSE-CAPILLARY: 89 mg/dL (ref 65–99)
Glucose-Capillary: 86 mg/dL (ref 65–99)
Glucose-Capillary: 84 mg/dL (ref 65–99)
Glucose-Capillary: 85 mg/dL (ref 65–99)

## 2017-10-17 LAB — POCT PREGNANCY, URINE: PREG TEST UR: NEGATIVE

## 2017-10-17 SURGERY — LUMBAR LAMINECTOMY/DECOMPRESSION MICRODISCECTOMY
Anesthesia: General | Site: Back

## 2017-10-17 MED ORDER — PROPOFOL 10 MG/ML IV BOLUS
INTRAVENOUS | Status: AC
  Filled 2017-10-17: qty 20

## 2017-10-17 MED ORDER — PROMETHAZINE HCL 25 MG/ML IJ SOLN: 6.2500 mg | INTRAMUSCULAR | Status: DC | PRN

## 2017-10-17 MED ORDER — SODIUM CHLORIDE 0.9% FLUSH: 3.0000 mL | Freq: Two times a day (BID) | INTRAVENOUS | Status: DC

## 2017-10-17 MED ORDER — SUGAMMADEX SODIUM 200 MG/2ML IV SOLN
INTRAVENOUS | Status: DC | PRN
  Administered 2017-10-17: 200 mg via INTRAVENOUS
  Administered 2017-10-17: 100 mg via INTRAVENOUS

## 2017-10-17 MED ORDER — FENTANYL CITRATE (PF) 100 MCG/2ML IJ SOLN
INTRAMUSCULAR | Status: DC | PRN
  Administered 2017-10-17: 50 ug via INTRAVENOUS
  Administered 2017-10-17 (×3): 100 ug via INTRAVENOUS

## 2017-10-17 MED ORDER — CEFAZOLIN SODIUM-DEXTROSE 2-4 GM/100ML-% IV SOLN
2.0000 g | INTRAVENOUS | Status: AC
  Administered 2017-10-17: 2 g via INTRAVENOUS

## 2017-10-17 MED ORDER — ONDANSETRON HCL 4 MG/2ML IJ SOLN: 4.0000 mg | Freq: Four times a day (QID) | INTRAMUSCULAR | Status: DC | PRN

## 2017-10-17 MED ORDER — METHOCARBAMOL 1000 MG/10ML IJ SOLN
500.0000 mg | Freq: Four times a day (QID) | INTRAMUSCULAR | Status: DC | PRN
  Filled 2017-10-17: qty 5

## 2017-10-17 MED ORDER — LACTATED RINGERS IV SOLN
INTRAVENOUS | Status: DC
  Administered 2017-10-17: 50 mL/h via INTRAVENOUS
  Administered 2017-10-17: 12:00:00 via INTRAVENOUS

## 2017-10-17 MED ORDER — ONDANSETRON HCL 4 MG PO TABS: 4.0000 mg | ORAL_TABLET | Freq: Four times a day (QID) | ORAL | Status: DC | PRN

## 2017-10-17 MED ORDER — CHLORHEXIDINE GLUCONATE 4 % EX LIQD: 60.0000 mL | Freq: Once | CUTANEOUS | Status: DC

## 2017-10-17 MED ORDER — ONDANSETRON HCL 4 MG/2ML IJ SOLN
INTRAMUSCULAR | Status: DC | PRN
  Administered 2017-10-17: 4 mg via INTRAVENOUS

## 2017-10-17 MED ORDER — LIDOCAINE HCL (CARDIAC) 20 MG/ML IV SOLN
INTRAVENOUS | Status: DC | PRN
  Administered 2017-10-17: 60 mg via INTRAVENOUS

## 2017-10-17 MED ORDER — TAPENTADOL HCL 50 MG PO TABS
75.0000 mg | ORAL_TABLET | Freq: Four times a day (QID) | ORAL | Status: DC | PRN
  Administered 2017-10-17 – 2017-10-18 (×3): 75 mg via ORAL
  Filled 2017-10-17 (×3): qty 2

## 2017-10-17 MED ORDER — DOCUSATE SODIUM 100 MG PO CAPS
100.0000 mg | ORAL_CAPSULE | Freq: Two times a day (BID) | ORAL | Status: DC
  Administered 2017-10-17 – 2017-10-18 (×2): 100 mg via ORAL
  Filled 2017-10-17 (×2): qty 1

## 2017-10-17 MED ORDER — FENTANYL CITRATE (PF) 250 MCG/5ML IJ SOLN
INTRAMUSCULAR | Status: AC
  Filled 2017-10-17: qty 5

## 2017-10-17 MED ORDER — HYDROMORPHONE HCL 1 MG/ML IJ SOLN
INTRAMUSCULAR | Status: AC
  Filled 2017-10-17: qty 1

## 2017-10-17 MED ORDER — LURASIDONE HCL 40 MG PO TABS
80.0000 mg | ORAL_TABLET | Freq: Every day | ORAL | Status: DC
  Administered 2017-10-17: 80 mg via ORAL
  Filled 2017-10-17: qty 2

## 2017-10-17 MED ORDER — ROCURONIUM BROMIDE 100 MG/10ML IV SOLN
INTRAVENOUS | Status: DC | PRN
  Administered 2017-10-17: 50 mg via INTRAVENOUS

## 2017-10-17 MED ORDER — DIPHENHYDRAMINE HCL 25 MG PO CAPS: 25.0000 mg | ORAL_CAPSULE | ORAL | Status: DC | PRN

## 2017-10-17 MED ORDER — SODIUM CHLORIDE 0.9% FLUSH: 3.0000 mL | INTRAVENOUS | Status: DC | PRN

## 2017-10-17 MED ORDER — INSULIN GLARGINE 100 UNIT/ML ~~LOC~~ SOLN
50.0000 [IU] | Freq: Every evening | SUBCUTANEOUS | Status: DC
  Filled 2017-10-17 (×2): qty 0.5

## 2017-10-17 MED ORDER — TRAZODONE HCL 50 MG PO TABS
50.0000 mg | ORAL_TABLET | Freq: Every day | ORAL | Status: DC
  Administered 2017-10-17: 100 mg via ORAL
  Filled 2017-10-17: qty 2

## 2017-10-17 MED ORDER — TAPENTADOL HCL 75 MG PO TABS: 75.0000 mg | ORAL_TABLET | Freq: Four times a day (QID) | ORAL | 0 refills | Status: DC | PRN

## 2017-10-17 MED ORDER — INSULIN DEGLUDEC 200 UNIT/ML ~~LOC~~ SOPN: 50.0000 [IU] | PEN_INJECTOR | Freq: Every evening | SUBCUTANEOUS | Status: DC

## 2017-10-17 MED ORDER — 0.9 % SODIUM CHLORIDE (POUR BTL) OPTIME
TOPICAL | Status: DC | PRN
  Administered 2017-10-17: 1000 mL

## 2017-10-17 MED ORDER — IRBESARTAN 300 MG PO TABS
300.0000 mg | ORAL_TABLET | Freq: Every day | ORAL | Status: DC
  Administered 2017-10-17 – 2017-10-18 (×2): 300 mg via ORAL
  Filled 2017-10-17 (×3): qty 1

## 2017-10-17 MED ORDER — BUPIVACAINE HCL (PF) 0.25 % IJ SOLN
INTRAMUSCULAR | Status: AC
  Filled 2017-10-17: qty 30

## 2017-10-17 MED ORDER — ALBUTEROL SULFATE (2.5 MG/3ML) 0.083% IN NEBU: 3.0000 mL | INHALATION_SOLUTION | Freq: Four times a day (QID) | RESPIRATORY_TRACT | Status: DC | PRN

## 2017-10-17 MED ORDER — SODIUM CHLORIDE 0.9 % IV SOLN: INTRAVENOUS | Status: DC

## 2017-10-17 MED ORDER — HYDROMORPHONE HCL 1 MG/ML IJ SOLN
0.2500 mg | INTRAMUSCULAR | Status: DC | PRN
  Administered 2017-10-17 (×2): 0.5 mg via INTRAVENOUS

## 2017-10-17 MED ORDER — POLYETHYLENE GLYCOL 3350 17 G PO PACK: 17.0000 g | PACK | Freq: Every day | ORAL | Status: DC | PRN

## 2017-10-17 MED ORDER — CLONIDINE HCL 0.1 MG PO TABS
0.1000 mg | ORAL_TABLET | Freq: Every day | ORAL | Status: DC
  Administered 2017-10-17: 0.1 mg via ORAL
  Filled 2017-10-17: qty 1

## 2017-10-17 MED ORDER — BUPIVACAINE HCL (PF) 0.25 % IJ SOLN
INTRAMUSCULAR | Status: DC | PRN
  Administered 2017-10-17: 20 mL

## 2017-10-17 MED ORDER — CEFAZOLIN SODIUM-DEXTROSE 1-4 GM/50ML-% IV SOLN
1.0000 g | Freq: Three times a day (TID) | INTRAVENOUS | Status: AC
  Administered 2017-10-17 – 2017-10-18 (×2): 1 g via INTRAVENOUS
  Filled 2017-10-17 (×2): qty 50

## 2017-10-17 MED ORDER — DIPHENHYDRAMINE HCL 50 MG/ML IJ SOLN
INTRAMUSCULAR | Status: DC | PRN
  Administered 2017-10-17: 25 mg via INTRAVENOUS

## 2017-10-17 MED ORDER — LORATADINE 10 MG PO TABS
10.0000 mg | ORAL_TABLET | Freq: Every day | ORAL | Status: DC
  Administered 2017-10-18: 10 mg via ORAL
  Filled 2017-10-17: qty 1

## 2017-10-17 MED ORDER — MIDAZOLAM HCL 2 MG/2ML IJ SOLN
INTRAMUSCULAR | Status: AC
  Filled 2017-10-17: qty 2

## 2017-10-17 MED ORDER — MENTHOL 3 MG MT LOZG: 1.0000 | LOZENGE | OROMUCOSAL | Status: DC | PRN

## 2017-10-17 MED ORDER — PHENOL 1.4 % MT LIQD: 1.0000 | OROMUCOSAL | Status: DC | PRN

## 2017-10-17 MED ORDER — MIDAZOLAM HCL 5 MG/5ML IJ SOLN
INTRAMUSCULAR | Status: DC | PRN
  Administered 2017-10-17: 2 mg via INTRAVENOUS

## 2017-10-17 MED ORDER — INSULIN ASPART 100 UNIT/ML ~~LOC~~ SOLN: 0.0000 [IU] | Freq: Three times a day (TID) | SUBCUTANEOUS | Status: DC

## 2017-10-17 MED ORDER — PROPOFOL 10 MG/ML IV BOLUS
INTRAVENOUS | Status: DC | PRN
  Administered 2017-10-17: 170 mg via INTRAVENOUS
  Administered 2017-10-17: 30 mg via INTRAVENOUS

## 2017-10-17 MED ORDER — SUGAMMADEX SODIUM 200 MG/2ML IV SOLN
INTRAVENOUS | Status: AC
  Filled 2017-10-17: qty 2

## 2017-10-17 MED ORDER — LIRAGLUTIDE 18 MG/3ML ~~LOC~~ SOPN: 1.8000 mg | PEN_INJECTOR | Freq: Every evening | SUBCUTANEOUS | Status: DC

## 2017-10-17 MED ORDER — METHOCARBAMOL 500 MG PO TABS
500.0000 mg | ORAL_TABLET | Freq: Four times a day (QID) | ORAL | Status: DC | PRN
  Administered 2017-10-17 – 2017-10-18 (×2): 500 mg via ORAL
  Filled 2017-10-17 (×2): qty 1

## 2017-10-17 MED ORDER — SODIUM CHLORIDE 0.9 % IV SOLN: 250.0000 mL | INTRAVENOUS | Status: DC

## 2017-10-17 SURGICAL SUPPLY — 42 items
BUR ROUND FLUTED 4 SOFT TCH (BURR) IMPLANT
CANISTER SUCT 3000ML PPV (MISCELLANEOUS) ×2 IMPLANT
CLSR STERI-STRIP ANTIMIC 1/2X4 (GAUZE/BANDAGES/DRESSINGS) ×2 IMPLANT
COVER SURGICAL LIGHT HANDLE (MISCELLANEOUS) ×2 IMPLANT
DECANTER SPIKE VIAL GLASS SM (MISCELLANEOUS) ×2 IMPLANT
DRAPE HALF SHEET 40X57 (DRAPES) ×4 IMPLANT
DRAPE MICROSCOPE LEICA (MISCELLANEOUS) ×2 IMPLANT
DRAPE SURG 17X23 STRL (DRAPES) ×2 IMPLANT
DRSG MEPILEX BORDER 4X4 (GAUZE/BANDAGES/DRESSINGS) ×2 IMPLANT
DURAPREP 26ML APPLICATOR (WOUND CARE) ×2 IMPLANT
ELECT BLADE 4.0 EZ CLEAN MEGAD (MISCELLANEOUS) ×2
ELECT REM PT RETURN 9FT ADLT (ELECTROSURGICAL) ×2
ELECTRODE BLDE 4.0 EZ CLN MEGD (MISCELLANEOUS) ×1 IMPLANT
ELECTRODE REM PT RTRN 9FT ADLT (ELECTROSURGICAL) ×1 IMPLANT
GLOVE BIOGEL PI IND STRL 8 (GLOVE) ×2 IMPLANT
GLOVE BIOGEL PI INDICATOR 8 (GLOVE) ×2
GLOVE ORTHO TXT STRL SZ7.5 (GLOVE) ×4 IMPLANT
GOWN STRL REUS W/ TWL LRG LVL3 (GOWN DISPOSABLE) ×2 IMPLANT
GOWN STRL REUS W/ TWL XL LVL3 (GOWN DISPOSABLE) ×1 IMPLANT
GOWN STRL REUS W/TWL 2XL LVL3 (GOWN DISPOSABLE) ×2 IMPLANT
GOWN STRL REUS W/TWL LRG LVL3 (GOWN DISPOSABLE) ×2
GOWN STRL REUS W/TWL XL LVL3 (GOWN DISPOSABLE) ×1
KIT BASIN OR (CUSTOM PROCEDURE TRAY) ×2 IMPLANT
KIT ROOM TURNOVER OR (KITS) ×2 IMPLANT
MANIFOLD NEPTUNE II (INSTRUMENTS) ×2 IMPLANT
NEEDLE HYPO 25GX1X1/2 BEV (NEEDLE) ×2 IMPLANT
NEEDLE SPNL 18GX3.5 QUINCKE PK (NEEDLE) ×2 IMPLANT
NS IRRIG 1000ML POUR BTL (IV SOLUTION) ×2 IMPLANT
PACK LAMINECTOMY ORTHO (CUSTOM PROCEDURE TRAY) ×2 IMPLANT
PAD ARMBOARD 7.5X6 YLW CONV (MISCELLANEOUS) ×4 IMPLANT
PATTIES SURGICAL .5 X.5 (GAUZE/BANDAGES/DRESSINGS) IMPLANT
PATTIES SURGICAL .75X.75 (GAUZE/BANDAGES/DRESSINGS) IMPLANT
SUT VIC AB 0 CT1 27 (SUTURE)
SUT VIC AB 0 CT1 27XBRD ANBCTR (SUTURE) IMPLANT
SUT VIC AB 1 CTX 36 (SUTURE) ×1
SUT VIC AB 1 CTX36XBRD ANBCTR (SUTURE) ×1 IMPLANT
SUT VIC AB 2-0 CT1 27 (SUTURE) ×1
SUT VIC AB 2-0 CT1 TAPERPNT 27 (SUTURE) ×1 IMPLANT
SUT VIC AB 3-0 X1 27 (SUTURE) ×2 IMPLANT
TAPE STRIPS DRAPE STRL (GAUZE/BANDAGES/DRESSINGS) ×2 IMPLANT
TOWEL OR 17X24 6PK STRL BLUE (TOWEL DISPOSABLE) ×2 IMPLANT
TOWEL OR 17X26 10 PK STRL BLUE (TOWEL DISPOSABLE) ×2 IMPLANT

## 2017-10-17 NOTE — Transfer of Care (Signed)
Immediate Anesthesia Transfer of Care Note  Patient: Rebecca Rice  Procedure(s) Performed: LEFT L5-S1 MICRODISCECTOMY (N/A Back)  Patient Location: PACU  Anesthesia Type:General  Level of Consciousness: awake and alert   Airway & Oxygen Therapy: Patient Spontanous Breathing  Post-op Assessment: Report given to RN and Post -op Vital signs reviewed and stable  Post vital signs: Reviewed and stable  Last Vitals:  Vitals:   10/17/17 0839  BP: (!) 160/92  Pulse: 79  Resp: 20  Temp: 36.8 C  SpO2: 99%    Last Pain:  Vitals:   10/17/17 0849  TempSrc:   PainSc: 5       Patients Stated Pain Goal: 3 (53/74/82 7078)  Complications: No apparent anesthesia complications

## 2017-10-17 NOTE — Anesthesia Procedure Notes (Signed)
Procedure Name: Intubation Date/Time: 10/17/2017 10:30 AM Performed by: Babs Bertin, CRNA Pre-anesthesia Checklist: Patient identified, Emergency Drugs available, Suction available and Patient being monitored Patient Re-evaluated:Patient Re-evaluated prior to induction Oxygen Delivery Method: Circle System Utilized Preoxygenation: Pre-oxygenation with 100% oxygen Induction Type: IV induction Ventilation: Mask ventilation without difficulty Laryngoscope Size: Glidescope (unable to view cords with MAC blade) Grade View: Grade II Tube type: Oral Tube size: 7.0 mm Number of attempts: 2 Airway Equipment and Method: Stylet and Oral airway Placement Confirmation: ETT inserted through vocal cords under direct vision,  positive ETCO2 and breath sounds checked- equal and bilateral Secured at: 21 cm Tube secured with: Tape Dental Injury: Teeth and Oropharynx as per pre-operative assessment

## 2017-10-17 NOTE — Anesthesia Preprocedure Evaluation (Addendum)
Anesthesia Evaluation  Patient identified by MRN, date of birth, ID band Patient awake    Reviewed: Allergy & Precautions, NPO status , Patient's Chart, lab work & pertinent test results  Airway Mallampati: III  TM Distance: >3 FB Neck ROM: Full    Dental no notable dental hx.    Pulmonary asthma , sleep apnea and Continuous Positive Airway Pressure Ventilation ,    Pulmonary exam normal breath sounds clear to auscultation       Cardiovascular hypertension, Pt. on medications Normal cardiovascular exam Rhythm:Regular Rate:Normal     Neuro/Psych PSYCHIATRIC DISORDERS Anxiety Bipolar Disorder TIA   GI/Hepatic Neg liver ROS, GERD  Controlled,  Endo/Other  diabetes, Oral Hypoglycemic Agents, Insulin Dependent  Renal/GU negative Renal ROS     Musculoskeletal negative musculoskeletal ROS (+)   Abdominal (+) + obese,   Peds  Hematology negative hematology ROS (+)   Anesthesia Other Findings LEFT L5-S1, PROTRUSION WITH RADICULOPATHY  Reproductive/Obstetrics                            Anesthesia Physical Anesthesia Plan  ASA: III  Anesthesia Plan: General   Post-op Pain Management:    Induction: Intravenous  PONV Risk Score and Plan: 3 and Ondansetron, Dexamethasone and Midazolam  Airway Management Planned: Oral ETT  Additional Equipment:   Intra-op Plan:   Post-operative Plan: Extubation in OR  Informed Consent: I have reviewed the patients History and Physical, chart, labs and discussed the procedure including the risks, benefits and alternatives for the proposed anesthesia with the patient or authorized representative who has indicated his/her understanding and acceptance.   Dental advisory given  Plan Discussed with: CRNA  Anesthesia Plan Comments:         Anesthesia Quick Evaluation

## 2017-10-17 NOTE — Op Note (Signed)
Preop diagnosis: Left L5-S1 large disc protrusion with radiculopathy.  Postop diagnosis: Same  Procedure: Left L5-S1 microdiscectomy, lateral recess decompression.  Surgeon: Rodell Perna MD  Assistant: Benjiman Core PA-C medically necessary and present for the entire procedure.  Anesthesia: General plus Marcaine skin local  Procedure: After induction general anesthesia orotracheal intubation patient was placed prone on chest rolls careful padding and positioning arms at 9090.  Pads over the ulnar nerve and rolled yellow foam underneath the shoulders.  Chin was clear 1010 drape was applied DuraPrep warmer and TED hose have been used.  Area squared with towels after the DuraPrep was dry Betadine Steri-Drape applied and laminectomy sheet.  Spinal localization with the needle crosstable lateral x-ray confirmed appropriate level.  3 cm incision was made based on localization x-ray.  Taylor retractor was placed laterally out on L5 but did not hold well.  McCullough retractor was used with good visualization.  Operative microscope was draped and brought in second x-ray was taken with pin field for down at the interlaminar space with confirmation of the appropriate level.  Bone was marked with purple marker and ligament was taken down between the lamina on the left at L5 and S1.  Some overhanging facets were trimmed back to the level of the pedicle chunks of ligamentum was removed.  There was large disc protrusion which had some separate bulges and it was incised with a scalpel and multiple chunks of disc were removed.  Disc space was tight and only micropituitary's would fit into the gap between the endplates.  Epstein curettes were used as well as ball-tip and black nerve hook to tease pieces that had been extruded in the pocket of the protruding disc and then removal with the up and down and straight micropituitary's.  Some disc material laterally was pushed down and removed.  Lateral recess was decompressed and  some overhanging spurs were removed as the nerve exited underneath the pedicle to make sure the nerve was completely free.  Axilla the nerve was checked.  Pocket of the protruding disc was cleaned out and large Epstein was used to press down the disc centrally and removed some additional fragments.  Nerve root was free passes anterior to the dura with the hockey-stick showed no areas compression.  Irrigation with saline solution dura was intact.  McCullough retractor was removed repeat irrigation and then standard layer closure with Vicryl in the fascia until Vicryl in the subcutaneous tissue subcuticular Vicryl skin closure postop dressing and transferred to recovery room in stable condition.

## 2017-10-17 NOTE — Interval H&P Note (Signed)
History and Physical Interval Note:  10/17/2017 9:11 AM  Kathrin Penner  has presented today for surgery, with the diagnosis of LEFT L5-S1, PROTRUSION WITH RADICULOPATHY  The various methods of treatment have been discussed with the patient and family. After consideration of risks, benefits and other options for treatment, the patient has consented to  Procedure(s): LEFT L5-S1 MICRODISCECTOMY (N/A) as a surgical intervention .  The patient's history has been reviewed, patient examined, no change in status, stable for surgery.  I have reviewed the patient's chart and labs.  Questions were answered to the patient's satisfaction.     Rebecca Rice

## 2017-10-17 NOTE — Discharge Instructions (Addendum)
Orthopedic discharge instructions   -Okay to shower 2 days postop.  No tub soaking.  Do not apply any creams or ointments to incision.  Daily dressing changes with 4 x 4 gauze and tape.  -Gradually increase walking distances but nothing too aggressive.  Must avoid bending, twisting, lifting, pushing, pulling.  -Absolutely no driving  -If you have increased pain, fever, chills he should contact our on-call physician immediately at [phone] or go immediately to the emergency room for evaluation

## 2017-10-18 DIAGNOSIS — M5117 Intervertebral disc disorders with radiculopathy, lumbosacral region: Secondary | ICD-10-CM | POA: Diagnosis not present

## 2017-10-18 LAB — BASIC METABOLIC PANEL
Anion gap: 11 (ref 5–15)
BUN: 14 mg/dL (ref 6–20)
CHLORIDE: 100 mmol/L — AB (ref 101–111)
CO2: 23 mmol/L (ref 22–32)
CREATININE: 1.01 mg/dL — AB (ref 0.44–1.00)
Calcium: 8.6 mg/dL — ABNORMAL LOW (ref 8.9–10.3)
GFR calc Af Amer: >60 mL/min (ref >60)
GFR calc non Af Amer: >60 mL/min (ref >60)
GLUCOSE: 90 mg/dL (ref 65–99)
Potassium: 4.2 mmol/L (ref 3.5–5.1)
SODIUM: 134 mmol/L — AB (ref 135–145)

## 2017-10-18 LAB — GLUCOSE, CAPILLARY: GLUCOSE-CAPILLARY: 86 mg/dL (ref 65–99)

## 2017-10-18 NOTE — Progress Notes (Addendum)
   Subjective: 1 Day Post-Op Procedure(s) (LRB): LEFT L5-S1 MICRODISCECTOMY (N/A) Patient reports pain as 4 on 0-10 scale.  Incisional pain. No leg pain now, gone from pre-op state.   Objective: Vital signs in last 24 hours: Temp:  [97.9 F (36.6 C)-100.8 F (38.2 C)] 97.9 F (36.6 C) (03/16 0750) Pulse Rate:  [78-91] 91 (03/16 0750) Resp:  [10-24] 16 (03/16 0750) BP: (125-175)/(58-98) 145/87 (03/16 0750) SpO2:  [95 %-100 %] 95 % (03/16 0750)  Intake/Output from previous day: 03/15 0701 - 03/16 0700 In: 1360 [P.O.:360; I.V.:1000] Out: 50 [Blood:50] Intake/Output this shift: Total I/O In: 360 [P.O.:360] Out: -   No results for input(s): HGB in the last 72 hours. No results for input(s): WBC, RBC, HCT, PLT in the last 72 hours. Recent Labs    10/18/17 0506  NA 134*  K 4.2  CL 100*  CO2 23  BUN 14  CREATININE 1.01*  GLUCOSE 90  CALCIUM 8.6*   No results for input(s): LABPT, INR in the last 72 hours.  Neurologically intact No results found.  Assessment/Plan: 1 Day Post-Op Procedure(s) (LRB): LEFT L5-S1 MICRODISCECTOMY (N/A) Plan: D/C home office one week. Parents wanted pt to stay with her for several days. Pt states she will not get any rest with them and has to do more things for them than the other way around. She has friends who will be with her at her house. Office one week.   Marybelle Killings 10/18/2017, 11:01 AM

## 2017-10-18 NOTE — Anesthesia Postprocedure Evaluation (Signed)
Anesthesia Post Note  Patient: Rebecca Rice  Procedure(s) Performed: LEFT L5-S1 MICRODISCECTOMY (N/A Back)     Patient location during evaluation: PACU Anesthesia Type: General Level of consciousness: awake and alert Pain management: pain level controlled Vital Signs Assessment: post-procedure vital signs reviewed and stable Respiratory status: spontaneous breathing, nonlabored ventilation, respiratory function stable and patient connected to nasal cannula oxygen Cardiovascular status: blood pressure returned to baseline and stable Postop Assessment: no apparent nausea or vomiting Anesthetic complications: no    Last Vitals:  Vitals:   10/17/17 2325 10/18/17 0348  BP: (!) 125/58 135/61  Pulse: 88 87  Resp: 16 16  Temp: 37.3 C 36.9 C  SpO2: 97% 98%    Last Pain:  Vitals:   10/18/17 0640  TempSrc:   PainSc: 6                  Ryan P Ellender

## 2017-10-18 NOTE — Evaluation (Signed)
Occupational Therapy Evaluation Patient Details Name: Rebecca Rice MRN: [id number] DOB: 08-06-1970 Today's Date: 10/18/2017    History of Present Illness Patient is a 47 yo remale s/p LEFT L5-S1 MICRODISCECTOMY    Clinical Impression   PTA, pt was living alone and was independent. Currently, pt performing for ADLs and functional mobility at supervision level. Provided education on back precautions, LB ADLs with AE, toilet transfer, and tub transfer; pt demonstrated and verbalized understanding. Answered all pt questions. Recommend dc home once medically stable per physician. All acute OT needs met and will sign off. Thank you.     Follow Up Recommendations  No OT follow up;Supervision - Intermittent    Equipment Recommendations  None recommended by OT    Recommendations for Other Services PT consult     Precautions / Restrictions Precautions Precautions: Back Precaution Booklet Issued: Yes (comment) Precaution Comments: verbally reviewed Restrictions Weight Bearing Restrictions: No      Mobility Bed Mobility Overal bed mobility: Independent                Transfers Overall transfer level: Independent                    Balance Overall balance assessment: No apparent balance deficits (not formally assessed)                                         ADL either performed or assessed with clinical judgement   ADL Overall ADL's : Needs assistance/impaired                                       General ADL Comments: Pt performing ADLs and functional mobility at supervision level. Providing pt with educaiton on back precautions, LB ADLs with AE, toileting, and tub transfer. PT verbalizing and demonstrating understanding     Vision Patient Visual Report: No change from baseline       Perception     Praxis      Pertinent Vitals/Pain Pain Assessment: Faces Faces Pain Scale: Hurts a little bit Pain Descriptors /  Indicators: Discomfort     Hand Dominance Right   Extremity/Trunk Assessment Upper Extremity Assessment Upper Extremity Assessment: Overall WFL for tasks assessed   Lower Extremity Assessment Lower Extremity Assessment: Overall WFL for tasks assessed   Cervical / Trunk Assessment Cervical / Trunk Assessment: Other exceptions Cervical / Trunk Exceptions: s/p back surgery   Communication Communication Communication: No difficulties   Cognition Arousal/Alertness: Awake/alert Behavior During Therapy: WFL for tasks assessed/performed Overall Cognitive Status: Within Functional Limits for tasks assessed                                     General Comments  educated on precautions, expectations, safety and mobility    Exercises     Shoulder Instructions      Home Living Family/patient expects to be discharged to:: Private residence Living Arrangements: Alone Available Help at Discharge: Friend(s);Available PRN/intermittently Type of Home: (condo) Home Access: Stairs to enter [employer] of Steps: 6 Entrance Stairs-Rails: Can reach both Home Layout: Two level Alternate Level Stairs-Number of Steps: 14 Alternate Level Stairs-Rails: Can reach both Bathroom Shower/Tub: Teacher, early years/pre: Standard  Home Equipment: Grab bars - tub/shower          Prior Functioning/Environment Level of Independence: Independent                 OT Problem List: Decreased activity tolerance;Decreased knowledge of precautions;Decreased knowledge of use of DME or AE;Pain      OT Treatment/Interventions:      OT Goals(Current goals can be found in the care plan section) Acute Rehab OT Goals Patient Stated Goal: Go home OT Goal Formulation: All assessment and education complete, DC therapy  OT Frequency:     Barriers to D/C:            Co-evaluation              AM-PAC PT "6 Clicks" Daily Activity     Outcome Measure Help  from another person eating meals?: None Help from another person taking care of personal grooming?: None Help from another person toileting, which includes using toliet, bedpan, or urinal?: None Help from another person bathing (including washing, rinsing, drying)?: A Little Help from another person to put on and taking off regular upper body clothing?: None Help from another person to put on and taking off regular lower body clothing?: A Little 6 Click Score: 22   End of Session Nurse Communication: Mobility status;Precautions  Activity Tolerance: Patient tolerated treatment well Patient left: in chair;with call bell/phone within reach  OT Visit Diagnosis: Unsteadiness on feet (R26.81);Pain Pain - part of body: (Back)                Time: 3235-5732 OT Time Calculation (min): 12 min Charges:  OT General Charges $OT Visit: 1 Visit OT Evaluation $OT Eval Low Complexity: 1 Low G-Codes:     Hamzah Savoca MSOT, OTR/L Acute Rehab Pager: [phone] Office: [place] 10/18/2017, 8:31 AM

## 2017-10-18 NOTE — Progress Notes (Signed)
RT placed pt on cpap with M medium full faced mask and EPAP of 11 per home settings. Pt tolerating well and RT to cont to monitor

## 2017-10-18 NOTE — Evaluation (Signed)
Physical Therapy Evaluation Patient Details Name: Rebecca Rice MRN: [id number] DOB: Aug 23, 1970 Today's Date: 10/18/2017   History of Present Illness  Patient is a 47 yo remale s/p LEFT L5-S1 MICRODISCECTOMY   Clinical Impression  Patient seen for mobility assessment s/p spinal surgery. Mobilizing well. Educated patient on precautions, mobility expectations, safety and car transfers. No further acute PT needs. Will sign off.     Follow Up Recommendations No PT follow up    Equipment Recommendations  None recommended by PT    Recommendations for Other Services       Precautions / Restrictions Precautions Precautions: Back Precaution Booklet Issued: Yes (comment) Precaution Comments: verbally reviewed Restrictions Weight Bearing Restrictions: No      Mobility  Bed Mobility Overal bed mobility: Independent                Transfers Overall transfer level: Independent                  Ambulation/Gait Ambulation/Gait assistance: Independent Ambulation Distance (Feet): 380 Feet Assistive device: None Gait Pattern/deviations: Step-through pattern;Decreased stride length Gait velocity: decreased   General Gait Details: steady with ambulation no difficulties  Stairs Stairs: Yes Stairs assistance: Supervision Stair Management: One rail Right Number of Stairs: 12 General stair comments: Cued for sequencing   Wheelchair Mobility    Modified Rankin (Stroke Patients Only)       Balance Overall balance assessment: No apparent balance deficits (not formally assessed)                                           Pertinent Vitals/Pain Pain Assessment: Faces Faces Pain Scale: Hurts a little bit Pain Descriptors / Indicators: Discomfort    Home Living Family/patient expects to be discharged to:: Private residence Living Arrangements: Alone Available Help at Discharge: Friend(s);Available PRN/intermittently Type of Home: (condo) Home  Access: Stairs to enter Entrance Stairs-Rails: Can reach both Entrance Stairs-Number of Steps: 6 Home Layout: Two level Home Equipment: Grab bars - tub/shower      Prior Function Level of Independence: Independent               Hand Dominance   Dominant Hand: Right    Extremity/Trunk Assessment   Upper Extremity Assessment Upper Extremity Assessment: Overall WFL for tasks assessed    Lower Extremity Assessment Lower Extremity Assessment: Overall WFL for tasks assessed       Communication   Communication: No difficulties  Cognition Arousal/Alertness: Awake/alert Behavior During Therapy: WFL for tasks assessed/performed Overall Cognitive Status: Within Functional Limits for tasks assessed                                        General Comments General comments (skin integrity, edema, etc.): educated on precautions, expectations, safety and mobility    Exercises     Assessment/Plan    PT Assessment    PT Problem List         PT Treatment Interventions      PT Goals (Current goals can be found in the Care Plan section)  Acute Rehab PT Goals PT Goal Formulation: All assessment and education complete, DC therapy    Frequency     Barriers to discharge        Co-evaluation  AM-PAC PT "6 Clicks" Daily Activity  Outcome Measure Difficulty turning over in bed (including adjusting bedclothes, sheets and blankets)?: None Difficulty moving from lying on back to sitting on the side of the bed? : A Little Difficulty sitting down on and standing up from a chair with arms (e.g., wheelchair, bedside commode, etc,.)?: A Little Help needed moving to and from a bed to chair (including a wheelchair)?: A Little Help needed walking in hospital room?: None Help needed climbing 3-5 steps with a railing? : A Little 6 Click Score: 20    End of Session   Activity Tolerance: Patient tolerated treatment well          Time:  0703-0721 PT Time Calculation (min) (ACUTE ONLY): 18 min   Charges:   PT Evaluation $PT Eval Low Complexity: 1 Low     PT G Codes:        Alben Deeds, PT DPT  Board Certified Neurologic Specialist [place] 10/18/2017, 7:29 AM

## 2017-10-18 NOTE — Progress Notes (Signed)
Patient alert and oriented, mae's well, voiding adequate amount of urine, swallowing without difficulty, no c/o pain at time of discharge. Patient discharged home with family. Script and discharged instructions given to patient. Patient and family stated understanding of instructions given. Patient has an appointment with Dr. Yates  

## 2017-10-20 ENCOUNTER — Encounter (HOSPITAL_COMMUNITY): Payer: Self-pay | Admitting: Orthopaedic Surgery

## 2017-10-24 ENCOUNTER — Ambulatory Visit (INDEPENDENT_AMBULATORY_CARE_PROVIDER_SITE_OTHER): Payer: BLUE CROSS/BLUE SHIELD | Admitting: Orthopaedic Surgery

## 2017-10-24 VITALS — BP 170/100 | HR 88 | Ht 62.0 in | Wt 192.0 lb

## 2017-10-24 DIAGNOSIS — Z9889 Other specified postprocedural states: Secondary | ICD-10-CM

## 2017-10-24 NOTE — Progress Notes (Signed)
Post-Op Visit Note   Patient: Rebecca Rice           Date of Birth: [birth year]           MRN: [id number] Visit Date: 10/24/2017 PCP: Jilda Panda, MD   Assessment & Plan: Follow-up L5-S1 microdiscectomy left on 10/17/17.  Steri-Strips changed incision looks good she is off her pain medicine she still has numbness in her foot she is doing her daily walking.  Chief Complaint: Post microdiscectomy L5-S1 10/17/17 Visit Diagnoses:  "                     "  Plan: Continue walking recheck 5 weeks.  We can discuss work resumption at that time.  Follow-Up Instructions: Return in about 5 weeks (around 11/28/2017).   Orders:  No orders of the defined types were placed in this encounter.  No orders of the defined types were placed in this encounter.   Imaging: No results found.  PMFS History: Patient Active Problem List   Diagnosis Date Noted  . HNP (herniated nucleus pulposus), lumbar 10/17/2017  . Acquired absence of breast and nipple 10/17/2011  . 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 Anemia   . Anxiety   . Arthritis    knees, ankles, hips  . Asthma    daily and prn inhalers; triggered by environmental allergies  . Bipolar disorder ([place])   . Cancer [facility]) 2012   breast, left   . Diabetes mellitus    NIDDM  type2  . Elevated liver enzymes since age 87  . GERD (gastroesophageal reflux disease)    OTC as needed  . Heart murmur    states no known problems  . Hypertension    under control; has been on med. > 12 yrs.  . Sleep apnea    cpap  summer  2018  . TIA (transient ischemic attack) age 23   TIA vs. Bell's palsy(was undetermined) - no residual deficits  . Wears glasses     No family history on file.  Past Surgical History:  Procedure Laterality Date  . BREAST CAPSULECTOMY WITH IMPLANT EXCHANGE  06/24/2012   Procedure: BREAST CAPSULECTOMY WITH IMPLANT EXCHANGE;  Surgeon: Theodoro Kos, DO;  Location: [place];  Service: Plastics;  Laterality: Bilateral;  removal of bilateral breast expanders capsulectomies and placement of implants    . BREAST IMPLANT EXCHANGE Bilateral 07/05/2013   Procedure: REMOVAL OF BILATERAL IMPLANTS REPLACEMENT OF BILATERAL IMPLANTS BILATERAL  ;  Surgeon: Cristine Polio, MD;  Location: [place];  Service: Plastics;  Laterality: Bilateral;  . BREAST RECONSTRUCTION  10/17/2011   Procedure: BREAST RECONSTRUCTION;  Surgeon: Theodoro Kos, DO;  Location: [place];  Service: Plastics;  Laterality: Bilateral;  bilateral breast reconstruction with expander and flex hd placement  . BREAST RECONSTRUCTION N/A 04/18/2014   Procedure: LOWER OF RIGHT INFRA MAMMARY FOLD/RIGHT AND LEFT NIPPLE AREOLA COMPLEX RECONSTRUCTION Additional Normal saline added to right breast implant;  Surgeon: Cristine Polio, MD;  Location: [place];  Service: Plastics;  Laterality: N/A;  . FOOT SURGERY Bilateral 01/2010; 1998  . LAPAROSCOPIC CHOLECYSTECTOMY SINGLE SITE WITH INTRAOPERATIVE CHOLANGIOGRAM N/A 12/29/2015   Procedure: LAPAROSCOPIC CHOLECYSTECTOMY SINGLE SITE WITH INTRA OP CHOLANGIOGRAM ;  Surgeon: Michael Boston, MD;  [facility];  Service: General;  Laterality: N/A;  . LASIK Bilateral 2002  . LIVER BIOPSY N/A 12/29/2015   Procedure: NEEDLE  CORE LIVER BIOPSY;  Surgeon: Michael Boston, MD;  [facility];  Service: General;  Laterality: N/A;  . LUMBAR LAMINECTOMY/DECOMPRESSION MICRODISCECTOMY N/A 10/17/2017   Procedure: LEFT L5-S1 MICRODISCECTOMY;  Surgeon: Marybelle Killings, MD;  Location: [place];  Service: Orthopedics;  Laterality: N/A;  . MASTECTOMY  11/21/2010   bilat. simple mastect.lt snbx  . MASTOPEXY Bilateral 02/21/2014   Procedure: SCAR REVISION RIGHT AND LEFT BREAST;  Surgeon: Cristine Polio, MD;  Location: [place];  Service: Plastics;  Laterality: Bilateral;  . SCAR REVISION Bilateral 07/05/2013   Procedure: SCAR  REVISION BILATERAL EXCISION OF SEVERE EXCESSIVE BREAST TISSUE ;  Surgeon: Cristine Polio, MD;  Location: [place];  Service: Plastics;  Laterality: Bilateral;  . WISDOM TOOTH EXTRACTION  age 59   Social History   Occupational History  . Not on file  Tobacco Use  . Smoking status: Never Smoker  . Smokeless tobacco: Never Used  Substance and Sexual Activity  . Alcohol use: Yes    Comment: rarely  . Drug use: No  . Sexual activity: Not on file

## 2017-10-27 ENCOUNTER — Encounter (INDEPENDENT_AMBULATORY_CARE_PROVIDER_SITE_OTHER): Payer: Self-pay | Admitting: Orthopaedic Surgery

## 2017-10-28 NOTE — Discharge Summary (Signed)
Patient ID: Rebecca Rice MRN: [id number] DOB/AGE: Feb 16, 1971 47 y.o.  Admit date: 10/17/2017 Discharge date: 10/28/2017  Admission Diagnoses:  Active Problems:   HNP (herniated nucleus pulposus), lumbar   Discharge Diagnoses:  Active Problems:   HNP (herniated nucleus pulposus), lumbar  status post Procedure(s): LEFT L5-S1 MICRODISCECTOMY  Past Medical History:  Diagnosis Date  . Anemia   . Anxiety   . Arthritis    knees, ankles, hips  . Asthma    daily and prn inhalers; triggered by environmental allergies  . Bipolar disorder ([place])   . Cancer [facility]) 2012   breast, left   . Diabetes mellitus    NIDDM  type2  . Elevated liver enzymes since age 38  . GERD (gastroesophageal reflux disease)    OTC as needed  . Heart murmur    states no known problems  . Hypertension    under control; has been on med. > 12 yrs.  . Sleep apnea    cpap  summer  2018  . TIA (transient ischemic attack) age 53   TIA vs. Bell's palsy(was undetermined) - no residual deficits  . Wears glasses     Surgeries: Procedure(s): LEFT L5-S1 MICRODISCECTOMY on 10/17/2017   Consultants:   Discharged Condition: Improved  Hospital Course: Rebecca Rice is an 47 y.o. female who was admitted 10/17/2017 for operative treatment of L5-S1 HNP. Patient failed conservative treatments (please see the history and physical for the specifics) and had severe unremitting pain that affects sleep, daily activities and work/hobbies. After pre-op clearance, the patient was taken to the operating room on 10/17/2017 and underwent  Procedure(s): LEFT L5-S1 MICRODISCECTOMY.    Patient was given perioperative antibiotics:  Anti-infectives (From admission, onward)   Start     Dose/Rate Route Frequency Ordered Stop   10/17/17 1800  ceFAZolin (ANCEF) IVPB 1 g/50 mL premix     1 g 100 mL/hr over 30 Minutes Intravenous Every 8 hours 10/17/17 1359 10/18/17 0122   10/17/17 0830  ceFAZolin (ANCEF) IVPB 2g/100 mL premix     2  g 200 mL/hr over 30 Minutes Intravenous On call to O.R. 10/17/17 3875 10/17/17 1034       Patient was given sequential compression devices and early ambulation to prevent DVT.   Patient benefited maximally from hospital stay and there were no complications. At the time of discharge, the patient was urinating/moving their bowels without difficulty, tolerating a regular diet, pain is controlled with oral pain medications and they have been cleared by PT/OT.   Recent vital signs: No data found.   Recent laboratory studies: No results for input(s): WBC, HGB, HCT, PLT, NA, K, CL, CO2, BUN, CREATININE, GLUCOSE, INR, CALCIUM in the last 72 hours.  Invalid input(s): PT, 2   Discharge Medications:   Allergies as of 10/18/2017      Reactions   Decongestant Formula Other (See Comments)   TACHYCARDIA   Grapefruit Flavor [flavoring Agent] Other (See Comments)   DRUG INTERACTION WITH LATUDA   Latex Rash   Aspirin    UNSPECIFIED REASON Told not take by md years ago   Nsaids    UNSPECIFIED REASON Told years ago by md not to take   Adhesive [tape] Rash   Oxycodone Itching   Tramadol Itching   Vicodin [hydrocodone-acetaminophen] Itching      Medication List    TAKE these medications   albuterol 108 (90 Base) MCG/ACT inhaler Commonly known as:  PROVENTIL HFA;VENTOLIN HFA Inhale 2 puffs into  the lungs every 6 (six) hours as needed for wheezing or shortness of breath.   amoxicillin 500 MG capsule Commonly known as:  AMOXIL Take 2,000 mg by mouth once. Before dental procedures   cetirizine 10 MG tablet Commonly known as:  ZYRTEC Take 10 mg by mouth daily as needed for allergies.   cloNIDine 0.1 MG tablet Commonly known as:  CATAPRES Take 0.1 mg by mouth at bedtime.   doxycycline 20 MG tablet Commonly known as:  PERIOSTAT Take 20 mg by mouth 2 (two) times daily.   EDARBI 40 MG Tabs Generic drug:  Azilsartan Medoxomil Take 40 mg elemental calcium/kg/hr by mouth daily.   LATUDA  80 MG Tabs tablet Generic drug:  lurasidone Take 80 mg by mouth at bedtime.   tapentadol HCl 75 MG tablet Commonly known as:  NUCYNTA Take 1 tablet (75 mg total) by mouth every 6 (six) hours as needed.   traZODone 50 MG tablet Commonly known as:  DESYREL Take 50-100 mg by mouth at bedtime.   TRESIBA FLEXTOUCH 200 UNIT/ML Sopn Generic drug:  Insulin Degludec Inject 50 Units into the skin every evening.   VICTOZA 18 MG/3ML Sopn Generic drug:  liraglutide Inject 1.8 mg into the skin every evening.       Diagnostic Studies: Dg Lumbar Spine 2-3 Views  Result Date: 10/17/2017 CLINICAL DATA:  Lumbar surgery. EXAM: LUMBAR SPINE - 2-3 VIEW COMPARISON:  MRI 09/18/2017. FINDINGS: Metallic marker noted posteriorly at L5-S1. No acute or focal bony abnormality. IMPRESSION: Metallic marker noted posteriorly at L5-S1. Electronically Signed   By: Marcello Moores  Register   On: 10/17/2017 11:01      Follow-up Information    Schedule an appointment as soon as possible for a visit with Rebecca Killings, MD.   Specialty:  Orthopedic Surgery Why:  need return office visit one week.   Contact information: [place] [address] [phone]           Discharge Plan:  discharge to home  Disposition:     Signed: Benjiman Core  10/28/2017, 3:32 PM

## 2017-11-25 ENCOUNTER — Ambulatory Visit (INDEPENDENT_AMBULATORY_CARE_PROVIDER_SITE_OTHER): Payer: BLUE CROSS/BLUE SHIELD | Admitting: Orthopaedic Surgery

## 2017-11-25 ENCOUNTER — Encounter (INDEPENDENT_AMBULATORY_CARE_PROVIDER_SITE_OTHER): Payer: Self-pay | Admitting: Orthopaedic Surgery

## 2017-11-25 VITALS — BP 154/88 | HR 88

## 2017-11-25 DIAGNOSIS — Z9889 Other specified postprocedural states: Secondary | ICD-10-CM

## 2017-11-25 NOTE — Progress Notes (Signed)
Post-Op Visit Note   Patient: Rebecca Rice           Date of Birth: [birth year]           MRN: [id number] Visit Date: 11/25/2017 PCP: Jilda Panda, MD   Assessment & Plan: Post left microdiscectomy.  She is moving well and is ready to resume work activity.  Work slip given for work resumption on 12/01/2017.  Patient is happy with the surgical result.  Return as needed.  Chief Complaint:  Chief Complaint  Patient presents with  . Lower Back - Routine Post Op   Visit Diagnoses:  1. Status post lumbar discectomy     Plan: Work resumption on 12/01/2017.  Follow-Up Instructions: Return if symptoms worsen or fail to improve.   Orders:  No orders of the defined types were placed in this encounter.  No orders of the defined types were placed in this encounter.   Imaging: No results found.  PMFS History: Patient Active Problem List   Diagnosis Date Noted  . HNP (herniated nucleus pulposus), lumbar 10/17/2017  . Acquired absence of breast and nipple 10/17/2011  . 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 Anemia   . Anxiety   . Arthritis    knees, ankles, hips  . Asthma    daily and prn inhalers; triggered by environmental allergies  . Bipolar disorder ([place])   . Cancer [facility]) 2012   breast, left   . Diabetes mellitus    NIDDM  type2  . Elevated liver enzymes since age 47  . GERD (gastroesophageal reflux disease)    OTC as needed  . Heart murmur    states no known problems  . Hypertension    under control; has been on med. > 12 yrs.  . Sleep apnea    cpap  summer  2018  . TIA (transient ischemic attack) age 47   TIA vs. Bell's palsy(was undetermined) - no residual deficits  . Wears glasses     No family history on file.  Past Surgical History:  Procedure Laterality Date  . BREAST CAPSULECTOMY WITH IMPLANT EXCHANGE  06/24/2012   Procedure: BREAST CAPSULECTOMY WITH IMPLANT EXCHANGE;  Surgeon: Theodoro Kos, DO;   Location: [place];  Service: Plastics;  Laterality: Bilateral;  removal of bilateral breast expanders capsulectomies and placement of implants    . BREAST IMPLANT EXCHANGE Bilateral 07/05/2013   Procedure: REMOVAL OF BILATERAL IMPLANTS REPLACEMENT OF BILATERAL IMPLANTS BILATERAL  ;  Surgeon: Cristine Polio, MD;  Location: [place];  Service: Plastics;  Laterality: Bilateral;  . BREAST RECONSTRUCTION  10/17/2011   Procedure: BREAST RECONSTRUCTION;  Surgeon: Theodoro Kos, DO;  Location: [place];  Service: Plastics;  Laterality: Bilateral;  bilateral breast reconstruction with expander and flex hd placement  . BREAST RECONSTRUCTION N/A 04/18/2014   Procedure: LOWER OF RIGHT INFRA MAMMARY FOLD/RIGHT AND LEFT NIPPLE AREOLA COMPLEX RECONSTRUCTION Additional Normal saline added to right breast implant;  Surgeon: Cristine Polio, MD;  Location: [place];  Service: Plastics;  Laterality: N/A;  . FOOT SURGERY Bilateral 01/2010; 1998  . LAPAROSCOPIC CHOLECYSTECTOMY SINGLE SITE WITH INTRAOPERATIVE CHOLANGIOGRAM N/A 12/29/2015   Procedure: LAPAROSCOPIC CHOLECYSTECTOMY SINGLE SITE WITH INTRA OP CHOLANGIOGRAM ;  Surgeon: Michael Boston, MD;  [facility];  Service: General;  Laterality: N/A;  . LASIK Bilateral 2002  . LIVER BIOPSY N/A 12/29/2015   Procedure: NEEDLE CORE LIVER BIOPSY;  Surgeon:  Michael Boston, MD;  [facility];  Service: General;  Laterality: N/A;  . LUMBAR LAMINECTOMY/DECOMPRESSION MICRODISCECTOMY N/A 10/17/2017   Procedure: LEFT L5-S1 MICRODISCECTOMY;  Surgeon: Marybelle Killings, MD;  Location: [place];  Service: Orthopedics;  Laterality: N/A;  . MASTECTOMY  11/21/2010   bilat. simple mastect.lt snbx  . MASTOPEXY Bilateral 02/21/2014   Procedure: SCAR REVISION RIGHT AND LEFT BREAST;  Surgeon: Cristine Polio, MD;  Location: [place];  Service: Plastics;  Laterality: Bilateral;  . SCAR REVISION Bilateral 07/05/2013     Procedure: SCAR REVISION BILATERAL EXCISION OF SEVERE EXCESSIVE BREAST TISSUE ;  Surgeon: Cristine Polio, MD;  Location: [place];  Service: Plastics;  Laterality: Bilateral;  . WISDOM TOOTH EXTRACTION  age 47   Social History   Occupational History  . Not on file  Tobacco Use  . Smoking status: Never Smoker  . Smokeless tobacco: Never Used  Substance and Sexual Activity  . Alcohol use: Yes    Comment: rarely  . Drug use: No  . Sexual activity: Not on file

## 2017-12-05 ENCOUNTER — Other Ambulatory Visit: Payer: Self-pay

## 2017-12-05 ENCOUNTER — Ambulatory Visit (HOSPITAL_COMMUNITY)
Admission: EM | Admit: 2017-12-05 | Discharge: 2017-12-05 | Disposition: A | Discharge status: Home / Self Care | Payer: Self-pay | Attending: Family Medicine | Admitting: Family Medicine

## 2017-12-05 ENCOUNTER — Ambulatory Visit (INDEPENDENT_AMBULATORY_CARE_PROVIDER_SITE_OTHER): Payer: Self-pay

## 2017-12-05 ENCOUNTER — Encounter (HOSPITAL_COMMUNITY): Payer: Self-pay

## 2017-12-05 ENCOUNTER — Telehealth (HOSPITAL_COMMUNITY): Payer: Self-pay

## 2017-12-05 DIAGNOSIS — S161XXA Strain of muscle, fascia and tendon at neck level, initial encounter: Secondary | ICD-10-CM

## 2017-12-05 DIAGNOSIS — M545 Low back pain, unspecified: Secondary | ICD-10-CM

## 2017-12-05 MED ORDER — METHOCARBAMOL 500 MG PO TABS: 500.0000 mg | ORAL_TABLET | Freq: Two times a day (BID) | ORAL | 0 refills | Status: DC
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
## 2017-12-05 NOTE — Discharge Instructions (Signed)
Ice to area for 20 min every couple of hours Take the robaxin as needed for muscle relaxer Take tylenol for pain Return as needed Activity as tolerated Call Dr Lorin Mercy on Monday

## 2017-12-08 ENCOUNTER — Telehealth (INDEPENDENT_AMBULATORY_CARE_PROVIDER_SITE_OTHER): Payer: Self-pay | Admitting: Orthopaedic Surgery
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
## 2017-12-10 NOTE — Telephone Encounter (Signed)
I called and spoke with patient.  She was worked in to schedule for Friday morning.

## 2017-12-12 ENCOUNTER — Ambulatory Visit (INDEPENDENT_AMBULATORY_CARE_PROVIDER_SITE_OTHER): Payer: BLUE CROSS/BLUE SHIELD | Admitting: Orthopaedic Surgery

## 2017-12-12 ENCOUNTER — Encounter (INDEPENDENT_AMBULATORY_CARE_PROVIDER_SITE_OTHER): Payer: Self-pay | Admitting: Orthopaedic Surgery

## 2017-12-12 VITALS — BP 143/92 | HR 89 | Ht 62.0 in | Wt 200.0 lb

## 2017-12-12 DIAGNOSIS — S161XXA Strain of muscle, fascia and tendon at neck level, initial encounter: Secondary | ICD-10-CM | POA: Diagnosis not present
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
## 2017-12-23 ENCOUNTER — Other Ambulatory Visit (HOSPITAL_COMMUNITY): Payer: Self-pay | Admitting: General Surgery

## 2018-01-01 ENCOUNTER — Other Ambulatory Visit: Payer: Self-pay

## 2018-01-01 ENCOUNTER — Ambulatory Visit (HOSPITAL_COMMUNITY)
Admission: RE | Admit: 2018-01-01 | Discharge: 2018-01-01 | Disposition: A | Discharge status: Home / Self Care | Payer: BLUE CROSS/BLUE SHIELD | Source: Ambulatory Visit | Attending: General Surgery | Admitting: General Surgery

## 2018-01-08 ENCOUNTER — Encounter: Payer: BLUE CROSS/BLUE SHIELD | Attending: General Surgery | Admitting: Skilled Nursing Facility1

## 2018-01-08 DIAGNOSIS — Z6836 Body mass index (BMI) 36.0-36.9, adult: Secondary | ICD-10-CM | POA: Diagnosis not present

## 2018-01-08 DIAGNOSIS — Z79899 Other long term (current) drug therapy: Secondary | ICD-10-CM | POA: Insufficient documentation

## 2018-01-08 DIAGNOSIS — I10 Essential (primary) hypertension: Secondary | ICD-10-CM | POA: Insufficient documentation

## 2018-01-08 DIAGNOSIS — Z888 Allergy status to other drugs, medicaments and biological substances status: Secondary | ICD-10-CM | POA: Diagnosis not present

## 2018-01-08 DIAGNOSIS — G4733 Obstructive sleep apnea (adult) (pediatric): Secondary | ICD-10-CM | POA: Insufficient documentation

## 2018-01-08 DIAGNOSIS — Z885 Allergy status to narcotic agent status: Secondary | ICD-10-CM | POA: Insufficient documentation

## 2018-01-08 DIAGNOSIS — Z91040 Latex allergy status: Secondary | ICD-10-CM | POA: Diagnosis not present

## 2018-01-08 DIAGNOSIS — E1169 Type 2 diabetes mellitus with other specified complication: Secondary | ICD-10-CM | POA: Diagnosis not present

## 2018-01-08 DIAGNOSIS — Z713 Dietary counseling and surveillance: Secondary | ICD-10-CM | POA: Insufficient documentation

## 2018-01-08 DIAGNOSIS — E119 Type 2 diabetes mellitus without complications: Secondary | ICD-10-CM

## 2018-01-08 NOTE — Patient Instructions (Signed)
[email or web address]  [facility] [id number])

## 2018-01-08 NOTE — Progress Notes (Signed)
Pre-Op Assessment Visit:  Pre-Operative Sleeve Gastrectomy Surgery  Medical Nutrition Therapy:  Appt start time: 7:54  End time:  9:00  Patient was seen on 01/08/2018 for Pre-Operative Nutrition Assessment. Assessment and letter of approval faxed to [facility] Surgery Bariatric Surgery Program coordinator on 01/08/2018.   Pt states she does not like being fat and due to back surgery she could not work out. Pt states a reasonable expectation of surgery is to get to 145 pounds but truly wants to be 125 pounds.  Pt states she wears her C-PAP every night. Pt states she has had diabetes since 2010. Pt states he last A1C was 4.2: typical blood sugars 80-200. Pt states any time she eats carbohydrate her blood sugars go over 200. Also stating her diet has always been spiratic with no carbohydrates at some meals then over consuming at other meals. Pts mental health was dicussed and her relationship with food.  Pt was given the mental health resource.  Pt expectation of surgery: to gve me an off switch to stop overeating   Pt expectation of Dietitian: none   Start weight at [facility]: 204.6 BMI: 37.79   24 hr Dietary Recall: First Meal: fast food Snack:  Second Meal: fast food Snack:  Third Meal: chicken and vegetables Snack:  Beverages: diet soda, water  Encouraged to engage in 150 minutes of moderate physical activity including cardiovascular and weight baring weekly  Handouts given during visit include:  . Pre-Op Goals . Bariatric Surgery Protein Shakes During the appointment today the following Pre-Op Goals were reviewed with the patient: . Maintain or lose weight as instructed by your surgeon . Make healthy food choices . Begin to limit portion sizes . Limited concentrated sugars and fried foods . Keep fat/sugar in the single digits per serving on             food labels . Practice CHEWING your food  (aim for 30 chews per bite or until applesauce consistency) . Practice not drinking  15 minutes before, during, and 30 minutes after each meal/snack . Avoid all carbonated beverages  . Avoid/limit caffeinated beverages  . Avoid all sugar-sweetened beverages . Consume 3 meals per day; eat every 3-5 hours . Make a list of non-food related activities . Aim for 64-100 ounces of FLUID daily  . Aim for at least 60-80 grams of PROTEIN daily . Look for a liquid protein source that contain ?15 g protein and ?5 g carbohydrate  (ex: shakes, drinks, shots)  -Follow diet recommendations listed below   Energy and Macronutrient Recomendations: Calories: 1500 Carbohydrate: 170 Protein: 112 Fat: 42  Demonstrated degree of understanding via:  Teach Back  Teaching Method Utilized:  Visual Auditory Hands on  Barriers to learning/adherence to lifestyle change: mental health  Patient to call the Nutrition and Diabetes Education Services to enroll in Pre-Op and Post-Op Nutrition Education when surgery date is scheduled.

## 2018-01-28 ENCOUNTER — Encounter (INDEPENDENT_AMBULATORY_CARE_PROVIDER_SITE_OTHER): Payer: Self-pay | Admitting: Orthopaedic Surgery

## 2018-01-28 ENCOUNTER — Ambulatory Visit (INDEPENDENT_AMBULATORY_CARE_PROVIDER_SITE_OTHER): Payer: BLUE CROSS/BLUE SHIELD | Admitting: Orthopaedic Surgery

## 2018-01-28 VITALS — BP 173/100 | HR 87 | Ht 62.0 in | Wt 210.0 lb

## 2018-01-28 DIAGNOSIS — M542 Cervicalgia: Secondary | ICD-10-CM | POA: Diagnosis not present
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
## 2018-03-24 ENCOUNTER — Ambulatory Visit (INDEPENDENT_AMBULATORY_CARE_PROVIDER_SITE_OTHER): Payer: BLUE CROSS/BLUE SHIELD | Admitting: Orthopaedic Surgery

## 2018-06-23 ENCOUNTER — Ambulatory Visit: Payer: BLUE CROSS/BLUE SHIELD | Admitting: Psychiatry

## 2018-06-23 ENCOUNTER — Encounter: Payer: Self-pay | Admitting: Psychiatry

## 2018-06-23 DIAGNOSIS — F3175 Bipolar disorder, in partial remission, most recent episode depressed: Secondary | ICD-10-CM

## 2018-06-23 MED ORDER — LURASIDONE HCL 120 MG PO TABS: 120.0000 mg | ORAL_TABLET | Freq: Every day | ORAL | 3 refills | Status: DC

## 2018-06-23 NOTE — Progress Notes (Signed)
Rebecca Rice [id number] 15-Jun-1971 47 y.o.  Subjective:   Patient ID:  Rebecca Rice is a 47 y.o. (DOB 05-14-1971) female.  Chief Complaint:  Chief Complaint  Patient presents with  . Follow-up    depression    HPI Rebecca Rice presents to the office today for follow-up of bipolar depression.  Several noted she's been down on herself.  Normal for me.   Physically not great; high glucose, regained weight she lost from 188 up to 217.  Part of why she's down on herself. Not really down and sad like she was before treatment.  Work function OK but hates the job.  Not what she hoped, but not in position to change jobs now. Pt reports that mood is Anxious and describes anxiety as Moderate. Anxiety symptoms include: Excessive Worry, about money bc just declared bankruptcy. Pt reports has interrupted sleep bc of elevated glucose. Pt reports that appetite is good. Pt reports that energy is no change and good. Concentration is down slightly. Suicidal thoughts:  denied by patient.    Review of Systems:  Review of Systems  Constitutional: Positive for fatigue.  Endocrine: Positive for polyuria.  Neurological: Negative for tremors and weakness.  Psychiatric/Behavioral: Negative for agitation, behavioral problems, confusion, decreased concentration, dysphoric mood, hallucinations, self-injury, sleep disturbance and suicidal ideas. The patient is not nervous/anxious and is not hyperactive.     Medications: I have reviewed the patient's current medications.  Current Outpatient Medications  Medication Sig Dispense Refill  . albuterol (PROVENTIL HFA;VENTOLIN HFA) 108 (90 BASE) MCG/ACT inhaler Inhale 2 puffs into the lungs every 6 (six) hours as needed for wheezing or shortness of breath.     . cetirizine (ZYRTEC) 10 MG tablet Take 10 mg by mouth daily as needed for allergies.     . cloNIDine (CATAPRES) 0.1 MG tablet Take 0.1 mg by mouth at bedtime.     [name] doxycycline (PERIOSTAT) 20 MG tablet Take  20 mg by mouth 2 (two) times daily.    . Lurasidone HCl (LATUDA) 120 MG TABS Take 80 mg by mouth at bedtime.     [name] telmisartan (MICARDIS) 80 MG tablet   6  . traZODone (DESYREL) 100 MG tablet Take 200 mg by mouth at bedtime as needed.   1  . TRESIBA FLEXTOUCH 200 UNIT/ML SOPN Inject 50 Units into the skin every evening.   5  . VICTOZA 18 MG/3ML SOPN Inject 1.8 mg into the skin every evening.   1  . amoxicillin (AMOXIL) 500 MG capsule Take 2,000 mg by mouth once. Before dental procedures     No current facility-administered medications for this visit.     Medication Side Effects: None  Allergies:  Allergies  Allergen Reactions  . Decongestant Formula Other (See Comments)    TACHYCARDIA  . Grapefruit Flavor [Flavoring Agent] Other (See Comments)    DRUG INTERACTION WITH LATUDA  . Latex Rash  . Aspirin     UNSPECIFIED REASON Told not take by md years ago  . Nsaids     UNSPECIFIED REASON Told years ago by md not to take  . Adhesive [Tape] Rash  . Oxycodone Itching  . Tramadol Itching  . Vicodin [Hydrocodone-Acetaminophen] Itching    Past Medical History:  Diagnosis Date  . Anemia   . Anxiety   . Arthritis    knees, ankles, hips  . Asthma    daily and prn inhalers; triggered by environmental allergies  . Bipolar disorder ([place])   . Cancer (  [place]) 2012   breast, left   . Diabetes mellitus    NIDDM  type2  . Elevated liver enzymes since age 2  . GERD (gastroesophageal reflux disease)    OTC as needed  . Heart murmur    states no known problems  . Hypertension    under control; has been on med. > 12 yrs.  . Sleep apnea    cpap  summer  2018  . TIA (transient ischemic attack) age 40   TIA vs. Bell's palsy(was undetermined) - no residual deficits  . Wears glasses     History reviewed. No pertinent family history.  Social History   Socioeconomic History  . Marital status: Single    Spouse name: Not on file  . Number of children: Not on file  . Years of  education: Not on file  . Highest education level: Not on file  Occupational History  . Not on file  Social Needs  . Financial resource strain: Not on file  . Food insecurity:    Worry: Not on file    Inability: Not on file  . Transportation needs:    Medical: Not on file    Non-medical: Not on file  Tobacco Use  . Smoking status: Never Smoker  . Smokeless tobacco: Never Used  Substance and Sexual Activity  . Alcohol use: Yes    Comment: rarely  . Drug use: No  . Sexual activity: Not on file  Lifestyle  . Physical activity:    Days per week: Not on file    Minutes per session: Not on file  . Stress: Not on file  Relationships  . Social connections:    Talks on phone: Not on file    Gets together: Not on file    Attends religious service: Not on file    Active member of club or organization: Not on file    Attends meetings of clubs or organizations: Not on file    Relationship status: Not on file  . Intimate partner violence:    Fear of current or ex partner: Not on file    Emotionally abused: Not on file    Physically abused: Not on file    Forced sexual activity: Not on file  Other Topics Concern  . Not on file  Social History Narrative  . Not on file    Past Medical History, Surgical history, Social history, and Family history were reviewed and updated as appropriate.   Please see review of systems for further details on the patient's review from today.   Objective:   Physical Exam:  There were no vitals taken for this visit.  Physical Exam  Constitutional: She is oriented to person, place, and time. She appears well-developed. No distress.  Musculoskeletal: She exhibits no deformity.  Neurological: She is alert and oriented to person, place, and time. She displays no tremor. Coordination and gait normal.  Psychiatric: She has a normal mood and affect. Her speech is normal and behavior is normal. Judgment and thought content normal. Her mood appears not  anxious. Her affect is not angry, not blunt, not labile and not inappropriate. Cognition and memory are normal. She does not exhibit a depressed mood. She expresses no homicidal and no suicidal ideation. She expresses no suicidal plans and no homicidal plans.  Insight intact. Judgment fair about self care. No auditory or visual hallucinations. No delusions.  Frustrated with parts of her life. She is attentive.    Lab Review:  Component Value Date/Time   NA 134 (L) 10/18/2017 0506   K 4.2 10/18/2017 0506   CL 100 (L) 10/18/2017 0506   CO2 23 10/18/2017 0506   GLUCOSE 90 10/18/2017 0506   BUN 14 10/18/2017 0506   CREATININE 1.01 (H) 10/18/2017 0506   CALCIUM 8.6 (L) 10/18/2017 0506   PROT 7.1 10/15/2017 0902   ALBUMIN 3.6 10/15/2017 0902   AST 30 10/15/2017 0902   ALT 24 10/15/2017 0902   ALKPHOS 36 (L) 10/15/2017 0902   BILITOT 1.2 10/15/2017 0902   GFRNONAA >60 10/18/2017 0506   GFRAA >60 10/18/2017 0506       Component Value Date/Time   WBC 5.4 10/15/2017 0902   RBC 3.88 10/15/2017 0902   HGB 12.7 10/15/2017 0902   HCT 38.2 10/15/2017 0902   PLT 234 10/15/2017 0902   MCV 98.5 10/15/2017 0902   MCH 32.7 10/15/2017 0902   MCHC 33.2 10/15/2017 0902   RDW 13.4 10/15/2017 0902   LYMPHSABS 5.2 (H) 11/14/2010 1053   MONOABS 0.6 11/14/2010 1053   EOSABS 0.1 11/14/2010 1053   BASOSABS 0.0 11/14/2010 1053    No results found for: POCLITH, LITHIUM   No results found for: PHENYTOIN, PHENOBARB, VALPROATE, CBMZ   .res Assessment: Plan:    Bipolar I disorder, current or most recent episode depressed, in partial remission with rapid cycling [facility])   She feels that if she cut Latuda dose she'd go on spending sprees.  Has been able to resisit the urge lately. Overall satisfied with the meds. No change indicated.  Failed several other psych meds.  Discussed potential metabolic side effects associated with atypical antipsychotics, as well as potential risk for movement side  effects. Advised pt to contact office if movement side effects occur.   Disc option of worry treatment.  Explained difference between and anxiety and normal worry.  Option of Buspar.  Disc in detail. She'll consider but we agree to defer.  This appointment was 15 minutes  FU 4 mos.  Lynder Parents, MD, DFAPA   Please see After Visit Summary for patient specific instructions.  No future appointments.  No orders of the defined types were placed in this encounter.     -------------------------------

## 2018-08-17 ENCOUNTER — Other Ambulatory Visit: Payer: Self-pay | Admitting: Psychiatry

## 2018-10-20 ENCOUNTER — Encounter: Payer: Self-pay | Admitting: Psychiatry

## 2018-10-20 ENCOUNTER — Other Ambulatory Visit: Payer: Self-pay

## 2018-10-20 ENCOUNTER — Ambulatory Visit: Payer: BLUE CROSS/BLUE SHIELD | Admitting: Psychiatry

## 2018-10-20 DIAGNOSIS — F5105 Insomnia due to other mental disorder: Secondary | ICD-10-CM

## 2018-10-20 DIAGNOSIS — F3175 Bipolar disorder, in partial remission, most recent episode depressed: Secondary | ICD-10-CM

## 2018-10-20 NOTE — Progress Notes (Signed)
Rebecca Rice [id number] [birth year] 48 y.o.  Subjective:   Patient ID:  Rebecca Rice is a 48 y.o. (DOB 1970/11/24) female.  Chief Complaint:  Chief Complaint  Patient presents with  . Follow-up    Medication Management    HPI  Last seen November Rebecca Rice presents to the office today for follow-up of bipolar depression.   No med changes last visit.  Pretty stable.  Patient reports stable mood and denies depressed or irritable moods.  Patient denies any recent difficulty with anxiety.  Patient denies difficulty with sleep initiation but some bad dreams awaking her 3-4 times weekly. For a couple of weeks.  No precipitant. Job going OK. No history of NM.  Sleep  With trazodone 200.   Denies appetite disturbance.  Patient reports that energy and motivation have been good.  Patient denies any difficulty with concentration.  Patient denies any suicidal ideation.  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  Past Psychiatric Medication Trials: lithium, Seroquel, VPA, carbamazepine risperidone, Latuda   Review of Systems:  Review of Systems  Constitutional: Positive for fatigue.  Endocrine: Positive for polyuria.  Neurological: Negative for tremors and weakness.  Psychiatric/Behavioral: Negative for agitation, behavioral problems, confusion, decreased concentration, dysphoric mood, hallucinations, self-injury, sleep disturbance and suicidal ideas. The patient is not nervous/anxious and is not hyperactive.     Medications: I have reviewed the patient's current medications.  Current Outpatient Medications  Medication Sig Dispense Refill  . albuterol (PROVENTIL HFA;VENTOLIN HFA) 108 (90 BASE) MCG/ACT inhaler Inhale 2 puffs into the lungs every 6 (six) hours as needed for wheezing or shortness of breath.     [name] amoxicillin (AMOXIL) 500 MG capsule Take 2,000 mg by mouth once. Before dental procedures    . cetirizine (ZYRTEC) 10 MG tablet Take 10 mg by mouth daily as needed for allergies.     .  cloNIDine (CATAPRES) 0.1 MG tablet Take 0.1 mg by mouth at bedtime.     [name] doxycycline (PERIOSTAT) 20 MG tablet Take 20 mg by mouth 2 (two) times daily.    . hydrochlorothiazide (HYDRODIURIL) 25 MG tablet TAKE 1 TABLET BY MOUTH ONCE DAILY FOR 30 DAYS    . Lurasidone HCl (LATUDA) 120 MG TABS Take 1 tablet (120 mg total) by mouth at bedtime. 30 tablet 3  . OZEMPIC, 1 MG/DOSE, 2 MG/1.5ML SOPN INJECT 1MG  EVERY WEEK ON THE SAME DAY OF EACH WEEK IN THE ABDOMEN THIGHS OR UPPER ARM ROTATING INJECTION SITES    . telmisartan (MICARDIS) 80 MG tablet   6  . traZODone (DESYREL) 100 MG tablet TAKE 1 TO 2 TABLETS BY MOUTH AT BEDTIME AS NEEDED FOR INSOMNIA 60 tablet 3  . TRESIBA FLEXTOUCH 200 UNIT/ML SOPN Inject 76 Units into the skin every evening.   5   No current facility-administered medications for this visit.     Medication Side Effects: 30# weight gain with the increase in the Latuda, appears stable.  Allergies:  Allergies  Allergen Reactions  . Decongestant Formula Other (See Comments)    TACHYCARDIA  . Grapefruit Flavor [Flavoring Agent] Other (See Comments)    DRUG INTERACTION WITH LATUDA  . Latex Rash  . Aspirin     UNSPECIFIED REASON Told not take by md years ago  . Nsaids     UNSPECIFIED REASON Told years ago by md not to take  . Adhesive [Tape] Rash  . Oxycodone Itching  . Tramadol Itching  . Vicodin [Hydrocodone-Acetaminophen] Itching    Past Medical History:  Diagnosis Date  . Anemia   . Anxiety   . Arthritis    knees, ankles, hips  . Asthma    daily and prn inhalers; triggered by environmental allergies  . Bipolar disorder ([place])   . Cancer [facility]) 2012   breast, left   . Diabetes mellitus    NIDDM  type2  . Elevated liver enzymes since age 68  . GERD (gastroesophageal reflux disease)    OTC as needed  . Heart murmur    states no known problems  . Hypertension    under control; has been on med. > 12 yrs.  . Sleep apnea    cpap  summer  2018  . TIA (transient  ischemic attack) age 38   TIA vs. Bell's palsy(was undetermined) - no residual deficits  . Wears glasses     History reviewed. No pertinent family history.  Social History   Socioeconomic History  . Marital status: Single    Spouse name: Not on file  . Number of children: Not on file  . Years of education: Not on file  . Highest education level: Not on file  Occupational History  . Not on file  Social Needs  . Financial resource strain: Not on file  . Food insecurity:    Worry: Not on file    Inability: Not on file  . Transportation needs:    Medical: Not on file    Non-medical: Not on file  Tobacco Use  . Smoking status: Never Smoker  . Smokeless tobacco: Never Used  Substance and Sexual Activity  . Alcohol use: Yes    Comment: rarely  . Drug use: No  . Sexual activity: Not on file  Lifestyle  . Physical activity:    Days per week: Not on file    Minutes per session: Not on file  . Stress: Not on file  Relationships  . Social connections:    Talks on phone: Not on file    Gets together: Not on file    Attends religious service: Not on file    Active member of club or organization: Not on file    Attends meetings of clubs or organizations: Not on file    Relationship status: Not on file  . Intimate partner violence:    Fear of current or ex partner: Not on file    Emotionally abused: Not on file    Physically abused: Not on file    Forced sexual activity: Not on file  Other Topics Concern  . Not on file  Social History Narrative  . Not on file    Past Medical History, Surgical history, Social history, and Family history were reviewed and updated as appropriate.   Please see review of systems for further details on the patient's review from today.   Objective:   Physical Exam:  There were no vitals taken for this visit.  Physical Exam Constitutional:      General: She is not in acute distress.    Appearance: She is well-developed.  Musculoskeletal:         General: No deformity.  Neurological:     Mental Status: She is alert and oriented to person, place, and time.     Motor: No tremor.     Coordination: Coordination normal.     Gait: Gait normal.  Psychiatric:        Attention and Perception: She is attentive.        Mood and Affect: Mood is not  anxious or depressed. Affect is not labile, blunt, angry or inappropriate.        Speech: Speech normal.        Behavior: Behavior normal.        Thought Content: Thought content normal. Thought content does not include homicidal or suicidal ideation. Thought content does not include homicidal or suicidal plan.        Judgment: Judgment normal.     Comments: Insight intact. Judgment fair about self care. No auditory or visual hallucinations. No delusions.  Frustrated with parts of her life.     Lab Review:     Component Value Date/Time   NA 134 (L) 10/18/2017 0506   K 4.2 10/18/2017 0506   CL 100 (L) 10/18/2017 0506   CO2 23 10/18/2017 0506   GLUCOSE 90 10/18/2017 0506   BUN 14 10/18/2017 0506   CREATININE 1.01 (H) 10/18/2017 0506   CALCIUM 8.6 (L) 10/18/2017 0506   PROT 7.1 10/15/2017 0902   ALBUMIN 3.6 10/15/2017 0902   AST 30 10/15/2017 0902   ALT 24 10/15/2017 0902   ALKPHOS 36 (L) 10/15/2017 0902   BILITOT 1.2 10/15/2017 0902   GFRNONAA >60 10/18/2017 0506   GFRAA >60 10/18/2017 0506       Component Value Date/Time   WBC 5.4 10/15/2017 0902   RBC 3.88 10/15/2017 0902   HGB 12.7 10/15/2017 0902   HCT 38.2 10/15/2017 0902   PLT 234 10/15/2017 0902   MCV 98.5 10/15/2017 0902   MCH 32.7 10/15/2017 0902   MCHC 33.2 10/15/2017 0902   RDW 13.4 10/15/2017 0902   LYMPHSABS 5.2 (H) 11/14/2010 1053   MONOABS 0.6 11/14/2010 1053   EOSABS 0.1 11/14/2010 1053   BASOSABS 0.0 11/14/2010 1053    No results found for: POCLITH, LITHIUM   No results found for: PHENYTOIN, PHENOBARB, VALPROATE, CBMZ   .res Assessment: Plan:    Bipolar I disorder, current or most recent  episode depressed, in partial remission with rapid cycling ([place])  Insomnia due to mental condition   She feels that if she cut Latuda dose she'd go on spending sprees.  Has been able to resisit the urge lately. Overall satisfied with the meds. No change indicated.  Failed several other psych meds.  The best response has been with Latuda.  Discussed the weight gain problem.  She is working on that with her primary care doctor and closely following her blood sugars.  Discussed potential metabolic side effects associated with atypical antipsychotics, as well as potential risk for movement side effects. Advised pt to contact office if movement side effects occur.   Disc option of worry treatment.  She has real financial stressors.  Explained difference between and anxiety and normal worry.  Option of Buspar.  Disc in detail. She'll consider but we agree to defer.  This appointment was 15 minutes  FU 5 mos.  Lynder Parents, MD, DFAPA   Please see After Visit Summary for patient specific instructions.  No future appointments.  No orders of the defined types were placed in this encounter.     -------------------------------

## 2018-12-21 ENCOUNTER — Other Ambulatory Visit: Payer: Self-pay

## 2018-12-21 MED ORDER — TRAZODONE HCL 100 MG PO TABS: ORAL_TABLET | ORAL | 3 refills | Status: DC

## 2018-12-21 MED ORDER — LURASIDONE HCL 120 MG PO TABS: 120.0000 mg | ORAL_TABLET | Freq: Every day | ORAL | 3 refills | Status: DC

## 2019-02-03 ENCOUNTER — Other Ambulatory Visit: Payer: Self-pay | Admitting: Psychiatry

## 2019-03-18 ENCOUNTER — Telehealth: Payer: Self-pay

## 2019-03-18 NOTE — Telephone Encounter (Signed)
Prior authorization approved for Latuda 120 mg through covermymeds with [employer] effective 03/12/2019-03/10/2022

## 2019-03-23 ENCOUNTER — Ambulatory Visit: Payer: BLUE CROSS/BLUE SHIELD | Admitting: Psychiatry

## 2019-03-26 ENCOUNTER — Other Ambulatory Visit: Payer: Self-pay

## 2019-03-26 ENCOUNTER — Encounter: Payer: Self-pay | Admitting: Psychiatry

## 2019-03-26 ENCOUNTER — Ambulatory Visit (INDEPENDENT_AMBULATORY_CARE_PROVIDER_SITE_OTHER): Payer: BC Managed Care – PPO | Admitting: Psychiatry

## 2019-03-26 DIAGNOSIS — F5105 Insomnia due to other mental disorder: Secondary | ICD-10-CM | POA: Diagnosis not present

## 2019-03-26 DIAGNOSIS — F3175 Bipolar disorder, in partial remission, most recent episode depressed: Secondary | ICD-10-CM | POA: Diagnosis not present

## 2019-03-26 DIAGNOSIS — F515 Nightmare disorder: Secondary | ICD-10-CM

## 2019-03-26 MED ORDER — LURASIDONE HCL 80 MG PO TABS: 80.0000 mg | ORAL_TABLET | Freq: Every day | ORAL | 1 refills | Status: DC

## 2019-03-26 NOTE — Progress Notes (Signed)
Rebecca Rice [id number] 1971/03/13 48 y.o.  Subjective:   Patient ID:  Rebecca Rice is a 48 y.o. (DOB Oct 02, 1970) female.  Chief Complaint:  Chief Complaint  Patient presents with  . Follow-up    Medication Management  . Depression    Medication Management    Depression        Associated symptoms include fatigue.  Associated symptoms include no decreased concentration and no suicidal ideas.   Rebecca Rice presents to the office today for follow-up of bipolar depression.   No med changes last visit.  Last seen March 2020.  Trouble with glucose and cholesterol.  Also just not feeling happy.  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  Anhedonia.  Less interest in reading.  Can't finish a book.    Patient reports stable mood but irritable moods.  No major swings otherwise but down.  No spending sprees as in the past.  Patient denies any recent difficulty with anxiety.  Patient denies difficulty with sleep initiation but some bad dreams awaking her 3-4 times weekly. For a couple of weeks.  No precipitant. Job going OK. No history of NM.  Sleep  With trazodone 200.   Denies appetite disturbance.  Patient reports that energy and motivation have been reduced.  Patient denies any difficulty with concentration.  Patient denies any suicidal ideation.  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  Past Psychiatric M edication Trials: lithium, Seroquel, VPA, carbamazepine risperidone, Latuda 120  Review of Systems:  Review of Systems  Constitutional: Positive for fatigue.  Endocrine: Positive for polyuria.  Neurological: Negative for tremors and weakness.  Psychiatric/Behavioral: Positive for depression. Negative for agitation, behavioral problems, confusion, decreased concentration, dysphoric mood, hallucinations, self-injury, sleep disturbance and suicidal ideas. The patient is not nervous/anxious and is not hyperactive.     Medications: I have reviewed  the patient's current medications.  Current Outpatient Medications  Medication Sig Dispense Refill  . albuterol (PROVENTIL HFA;VENTOLIN HFA) 108 (90 BASE) MCG/ACT inhaler Inhale 2 puffs into the lungs every 6 (six) hours as needed for wheezing or shortness of breath.     [name] amoxicillin (AMOXIL) 500 MG capsule Take 2,000 mg by mouth once. Before dental procedures    . atorvastatin (LIPITOR) 20 MG tablet Take 20 mg by mouth daily.    . cetirizine (ZYRTEC) 10 MG tablet Take 10 mg by mouth daily as needed for allergies.     . cloNIDine (CATAPRES) 0.1 MG tablet Take 0.1 mg by mouth at bedtime.     [name] doxycycline (PERIOSTAT) 20 MG tablet Take 20 mg by mouth 2 (two) times daily.    . Empagliflozin (JARDIANCE PO) Take by mouth.    . hydrochlorothiazide (HYDRODIURIL) 25 MG tablet TAKE 1 TABLET BY MOUTH ONCE DAILY FOR 30 DAYS    . OZEMPIC, 1 MG/DOSE, 2 MG/1.5ML SOPN INJECT 1MG  EVERY WEEK ON THE SAME DAY OF EACH WEEK IN THE ABDOMEN THIGHS OR UPPER ARM ROTATING INJECTION SITES    . telmisartan (MICARDIS) 80 MG tablet   6  . traZODone (DESYREL) 100 MG tablet TAKE 1 TO 2 TABLETS BY MOUTH AT BEDTIME AS NEEDED FOR INSOMNIA 180 tablet 0  . TRESIBA FLEXTOUCH 200 UNIT/ML SOPN Inject 80 Units into the skin every evening.   5  . VASCEPA 1 g CAPS TAKE TWO CAPSULES BY MOUTH TWICE EVERY DAY WITH FOOD SWALLOW WHOLE     No current facility-administered medications for this visit.  Medication Side Effects: 30# weight gain with the increase in the Latuda, appears stable.  Allergies:  Allergies  Allergen Reactions  . Decongestant Formula Other (See Comments)    TACHYCARDIA  . Grapefruit Flavor [Flavoring Agent] Other (See Comments)    DRUG INTERACTION WITH LATUDA  . Latex Rash  . Aspirin     UNSPECIFIED REASON Told not take by md years ago  . Nsaids     UNSPECIFIED REASON Told years ago by md not to take  . Adhesive [Tape] Rash  . Oxycodone Itching  . Tramadol Itching  . Vicodin  [Hydrocodone-Acetaminophen] Itching    Past Medical History:  Diagnosis Date  . Anemia   . Anxiety   . Arthritis    knees, ankles, hips  . Asthma    daily and prn inhalers; triggered by environmental allergies  . Bipolar disorder ([place])   . Cancer [facility]) 2012   breast, left   . Diabetes mellitus    NIDDM  type2  . Elevated liver enzymes since age 48  . GERD (gastroesophageal reflux disease)    OTC as needed  . Heart murmur    states no known problems  . Hypertension    under control; has been on med. > 12 yrs.  . Sleep apnea    cpap  summer  2018  . TIA (transient ischemic attack) age 48   TIA vs. Bell's palsy(was undetermined) - no residual deficits  . Wears glasses     History reviewed. No pertinent family history.  Social History   Socioeconomic History  . Marital status: Single    Spouse name: Not on file  . Number of children: Not on file  . Years of education: Not on file  . Highest education level: Not on file  Occupational History  . Not on file  Social Needs  . Financial resource strain: Not on file  . Food insecurity    Worry: Not on file    Inability: Not on file  . Transportation needs    Medical: Not on file    Non-medical: Not on file  Tobacco Use  . Smoking status: Never Smoker  . Smokeless tobacco: Never Used  Substance and Sexual Activity  . Alcohol use: Yes    Comment: rarely  . Drug use: No  . Sexual activity: Not on file  Lifestyle  . Physical activity    Days per week: Not on file    Minutes per session: Not on file  . Stress: Not on file  Relationships  . Social Herbalist on phone: Not on file    Gets together: Not on file    Attends religious service: Not on file    Active member of club or organization: Not on file    Attends meetings of clubs or organizations: Not on file    Relationship status: Not on file  . Intimate partner violence    Fear of current or ex partner: Not on file    Emotionally abused: Not on  file    Physically abused: Not on file    Forced sexual activity: Not on file  Other Topics Concern  . Not on file  Social History Narrative  . Not on file    Past Medical History, Surgical history, Social history, and Family history were reviewed and updated as appropriate.   Please see review of systems for further details on the patient's review from today.   Objective:   Physical Exam:  There were no vitals taken for this visit.  Physical Exam Constitutional:      General: She is not in acute distress.    Appearance: She is well-developed.  Musculoskeletal:        General: No deformity.  Neurological:     Mental Status: She is alert and oriented to person, place, and time.     Motor: No tremor.     Coordination: Coordination normal.     Gait: Gait normal.  Psychiatric:        Attention and Perception: She is attentive.        Mood and Affect: Mood is depressed. Mood is not anxious. Affect is not labile, blunt, angry or inappropriate.        Speech: Speech normal.        Behavior: Behavior normal.        Thought Content: Thought content normal. Thought content does not include homicidal or suicidal ideation. Thought content does not include homicidal or suicidal plan.        Judgment: Judgment normal.     Comments: Insight intact. Judgment fair about self care. No auditory or visual hallucinations. No delusions.  anhedonia     Lab Review:     Component Value Date/Time   NA 134 (L) 10/18/2017 0506   K 4.2 10/18/2017 0506   CL 100 (L) 10/18/2017 0506   CO2 23 10/18/2017 0506   GLUCOSE 90 10/18/2017 0506   BUN 14 10/18/2017 0506   CREATININE 1.01 (H) 10/18/2017 0506   CALCIUM 8.6 (L) 10/18/2017 0506   PROT 7.1 10/15/2017 0902   ALBUMIN 3.6 10/15/2017 0902   AST 30 10/15/2017 0902   ALT 24 10/15/2017 0902   ALKPHOS 36 (L) 10/15/2017 0902   BILITOT 1.2 10/15/2017 0902   GFRNONAA >60 10/18/2017 0506   GFRAA >60 10/18/2017 0506       Component Value  Date/Time   WBC 5.4 10/15/2017 0902   RBC 3.88 10/15/2017 0902   HGB 12.7 10/15/2017 0902   HCT 38.2 10/15/2017 0902   PLT 234 10/15/2017 0902   MCV 98.5 10/15/2017 0902   MCH 32.7 10/15/2017 0902   MCHC 33.2 10/15/2017 0902   RDW 13.4 10/15/2017 0902   LYMPHSABS 5.2 (H) 11/14/2010 1053   MONOABS 0.6 11/14/2010 1053   EOSABS 0.1 11/14/2010 1053   BASOSABS 0.0 11/14/2010 1053    No results found for: POCLITH, LITHIUM   No results found for: PHENYTOIN, PHENOBARB, VALPROATE, CBMZ   .res Assessment: Plan:    Bipolar I disorder, current or most recent episode depressed, in partial remission with rapid cycling ([place])  Insomnia due to mental condition  Nightmare   History of bipolar disorder.  In the past She was concerned that if she cut Latuda dose she'd go on spending sprees.  Has been able to resisit the urge lately.   Looks more depressed DT anhedonia and other sx listed.  Latuda high dose may be flattening her too much.  Suggest trial reduction Latuda to 80 but there is a risk of return of spending or mania.    Another option add Wellbutrin.     Failed several other psych meds.  The best response has been with Latuda.  Discussed the weight gain problem.  She is working on that with her primary care doctor and closely following her blood sugars.  Reduce Latuda to 80 mg daily.  Discussed potential metabolic side effects associated with atypical antipsychotics, as well as potential risk for movement  side effects. Advised pt to contact office if movement side effects occur.   Disc option of worry treatment.  She has real financial stressors.  Explained difference between and anxiety and normal worry.  Option of Buspar.  Disc in detail. She'll consider but we agree to defer.  This appointment was 25 minutes  FU 2 mos.  Lynder Parents, MD, DFAPA   Please see After Visit Summary for patient specific instructions.  No future appointments.  No orders of the defined types were  placed in this encounter.     -------------------------------

## 2019-05-02 ENCOUNTER — Other Ambulatory Visit: Payer: Self-pay | Admitting: Psychiatry

## 2019-05-22 ENCOUNTER — Other Ambulatory Visit: Payer: Self-pay | Admitting: Psychiatry

## 2019-05-22 DIAGNOSIS — F3175 Bipolar disorder, in partial remission, most recent episode depressed: Secondary | ICD-10-CM

## 2019-05-24 ENCOUNTER — Encounter: Payer: Self-pay | Admitting: Psychiatry

## 2019-05-24 ENCOUNTER — Ambulatory Visit: Payer: BC Managed Care – PPO | Admitting: Psychiatry

## 2019-05-24 ENCOUNTER — Other Ambulatory Visit: Payer: Self-pay

## 2019-05-24 ENCOUNTER — Ambulatory Visit (INDEPENDENT_AMBULATORY_CARE_PROVIDER_SITE_OTHER): Payer: BC Managed Care – PPO | Admitting: Psychiatry

## 2019-05-24 DIAGNOSIS — F3175 Bipolar disorder, in partial remission, most recent episode depressed: Secondary | ICD-10-CM | POA: Diagnosis not present

## 2019-05-24 DIAGNOSIS — F5105 Insomnia due to other mental disorder: Secondary | ICD-10-CM

## 2019-05-24 DIAGNOSIS — F515 Nightmare disorder: Secondary | ICD-10-CM | POA: Diagnosis not present

## 2019-05-24 MED ORDER — LATUDA 120 MG PO TABS: 120.0000 mg | ORAL_TABLET | ORAL | 3 refills | Status: DC

## 2019-05-24 MED ORDER — BUPROPION HCL ER (XL) 150 MG PO TB24: 150.0000 mg | ORAL_TABLET | Freq: Every day | ORAL | 0 refills | Status: DC

## 2019-05-24 NOTE — Progress Notes (Signed)
EASTON GALES [id number] 1971/02/09 48 y.o.  Subjective:   Patient ID:  Rebecca Rice is a 48 y.o. (DOB [birth year]) female.  Chief Complaint:  Chief Complaint  Patient presents with  . Follow-up    Medication Management  . Other    Bipolar 1    Depression        Associated symptoms include fatigue.  Associated symptoms include no decreased concentration and no suicidal ideas.   Rebecca Rice presents to the office today for follow-up of bipolar depression.   Last seen March 26, 2019.  The patient was complaining of flatness with some depression.  There was concern that the high dose Latuda may be flattening her somewhat and so it was reduced from 120 mg to 80 mg.  We discussed the risk of developing mania.  Increased spending since reduction of Latuda. Forced self to read more.  No better creativity.  No better energy.  More tired bc wired at night and sleeping less.  Spending time with parents.  F has dementia.  Still anhedonia.  Trouble with glucose and cholesterol.  Also just not feeling happy.  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  Anhedonia.  Less interest in reading.  Always enjoyed that.   No major swings otherwise but down.  No spending sprees as in the past.  Patient denies any recent difficulty with anxiety.  Patient denies difficulty with sleep initiation but some bad dreams awaking her 3-4 times weekly. For a couple of weeks.  No precipitant. Job going OK. No history of NM.  Sleep  With trazodone 200.   Denies appetite disturbance.  Patient reports that energy and motivation have been reduced.  Patient denies any difficulty with concentration.  Patient denies any suicidal ideation.  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  Past Psychiatric M edication Trials: lithium, Seroquel, VPA, carbamazepine risperidone, Latuda 120  Review of Systems:  Review of Systems  Constitutional: Positive for fatigue.  Endocrine: Positive for  polyuria.  Neurological: Negative for tremors, weakness and light-headedness.  Psychiatric/Behavioral: Positive for depression. Negative for agitation, behavioral problems, confusion, decreased concentration, dysphoric mood, hallucinations, self-injury, sleep disturbance and suicidal ideas. The patient is not nervous/anxious and is not hyperactive.     Medications: I have reviewed the patient's current medications.  Current Outpatient Medications  Medication Sig Dispense Refill  . albuterol (PROVENTIL HFA;VENTOLIN HFA) 108 (90 BASE) MCG/ACT inhaler Inhale 2 puffs into the lungs every 6 (six) hours as needed for wheezing or shortness of breath.     [name] amoxicillin (AMOXIL) 500 MG capsule Take 2,000 mg by mouth once. Before dental procedures    . atorvastatin (LIPITOR) 20 MG tablet Take 20 mg by mouth daily.    . cetirizine (ZYRTEC) 10 MG tablet Take 10 mg by mouth daily as needed for allergies.     . cloNIDine (CATAPRES) 0.1 MG tablet Take 0.1 mg by mouth at bedtime.     [name] doxycycline (PERIOSTAT) 20 MG tablet Take 20 mg by mouth 2 (two) times daily.    . Empagliflozin (JARDIANCE PO) Take 25 mg by mouth daily.     . hydrochlorothiazide (HYDRODIURIL) 25 MG tablet TAKE 1 TABLET BY MOUTH ONCE DAILY FOR 30 DAYS    . OZEMPIC, 1 MG/DOSE, 2 MG/1.5ML SOPN INJECT 1MG  EVERY WEEK ON THE SAME DAY OF EACH WEEK IN THE ABDOMEN THIGHS OR UPPER ARM ROTATING INJECTION SITES    . telmisartan (MICARDIS) 80 MG  tablet   6  . traZODone (DESYREL) 100 MG tablet TAKE 1 TO 2 TABLETS BY MOUTH AT BEDTIME AS NEEDED FOR INSOMNIA 180 tablet 0  . TRESIBA FLEXTOUCH 200 UNIT/ML SOPN Inject 80 Units into the skin every evening.   5  . VASCEPA 1 g CAPS TAKE TWO CAPSULES BY MOUTH TWICE EVERY DAY WITH FOOD SWALLOW WHOLE    . buPROPion (WELLBUTRIN XL) 150 MG 24 hr tablet Take 1 tablet (150 mg total) by mouth daily. 30 tablet 0  . Lurasidone HCl (LATUDA) 120 MG TABS Take 1 tablet (120 mg total) by mouth every morning. 30 tablet 3   No  current facility-administered medications for this visit.     Medication Side Effects: 30# weight gain with the increase in the Latuda, appears stable.  Allergies:  Allergies  Allergen Reactions  . Decongestant Formula Other (See Comments)    TACHYCARDIA  . Grapefruit Flavor [Flavoring Agent] Other (See Comments)    DRUG INTERACTION WITH LATUDA  . Latex Rash  . Aspirin     UNSPECIFIED REASON Told not take by md years ago  . Nsaids     UNSPECIFIED REASON Told years ago by md not to take  . Adhesive [Tape] Rash  . Oxycodone Itching  . Tramadol Itching  . Vicodin [Hydrocodone-Acetaminophen] Itching    Past Medical History:  Diagnosis Date  . Anemia   . Anxiety   . Arthritis    knees, ankles, hips  . Asthma    daily and prn inhalers; triggered by environmental allergies  . Bipolar disorder ([place])   . Cancer [facility]) 2012   breast, left   . Diabetes mellitus    NIDDM  type2  . Elevated liver enzymes since age 21  . GERD (gastroesophageal reflux disease)    OTC as needed  . Heart murmur    states no known problems  . Hypertension    under control; has been on med. > 12 yrs.  . Sleep apnea    cpap  summer  2018  . TIA (transient ischemic attack) age 66   TIA vs. Bell's palsy(was undetermined) - no residual deficits  . Wears glasses     History reviewed. No pertinent family history.  Social History   Socioeconomic History  . Marital status: Single    Spouse name: Not on file  . Number of children: Not on file  . Years of education: Not on file  . Highest education level: Not on file  Occupational History  . Not on file  Social Needs  . Financial resource strain: Not on file  . Food insecurity    Worry: Not on file    Inability: Not on file  . Transportation needs    Medical: Not on file    Non-medical: Not on file  Tobacco Use  . Smoking status: Never Smoker  . Smokeless tobacco: Never Used  Substance and Sexual Activity  . Alcohol use: Yes    Comment:  rarely  . Drug use: No  . Sexual activity: Not on file  Lifestyle  . Physical activity    Days per week: Not on file    Minutes per session: Not on file  . Stress: Not on file  Relationships  . Social Herbalist on phone: Not on file    Gets together: Not on file    Attends religious service: Not on file    Active member of club or organization: Not on file  Attends meetings of clubs or organizations: Not on file    Relationship status: Not on file  . Intimate partner violence    Fear of current or ex partner: Not on file    Emotionally abused: Not on file    Physically abused: Not on file    Forced sexual activity: Not on file  Other Topics Concern  . Not on file  Social History Narrative  . Not on file    Past Medical History, Surgical history, Social history, and Family history were reviewed and updated as appropriate.   Please see review of systems for further details on the patient's review from today.   Objective:   Physical Exam:  There were no vitals taken for this visit.  Physical Exam Constitutional:      General: She is not in acute distress.    Appearance: She is well-developed. She is obese.  Musculoskeletal:        General: No deformity.  Neurological:     Mental Status: She is alert and oriented to person, place, and time.     Motor: No tremor.     Coordination: Coordination normal.     Gait: Gait normal.  Psychiatric:        Attention and Perception: She is attentive.        Mood and Affect: Mood is depressed. Mood is not anxious. Affect is not labile, blunt, angry or inappropriate.        Speech: Speech normal.        Behavior: Behavior normal.        Thought Content: Thought content normal. Thought content does not include homicidal or suicidal ideation. Thought content does not include homicidal or suicidal plan.        Cognition and Memory: Cognition normal.        Judgment: Judgment normal.     Comments: Insight intact. Judgment  fair about self care. No auditory or visual hallucinations. No delusions.  anhedonia     Lab Review:     Component Value Date/Time   NA 134 (L) 10/18/2017 0506   K 4.2 10/18/2017 0506   CL 100 (L) 10/18/2017 0506   CO2 23 10/18/2017 0506   GLUCOSE 90 10/18/2017 0506   BUN 14 10/18/2017 0506   CREATININE 1.01 (H) 10/18/2017 0506   CALCIUM 8.6 (L) 10/18/2017 0506   PROT 7.1 10/15/2017 0902   ALBUMIN 3.6 10/15/2017 0902   AST 30 10/15/2017 0902   ALT 24 10/15/2017 0902   ALKPHOS 36 (L) 10/15/2017 0902   BILITOT 1.2 10/15/2017 0902   GFRNONAA >60 10/18/2017 0506   GFRAA >60 10/18/2017 0506       Component Value Date/Time   WBC 5.4 10/15/2017 0902   RBC 3.88 10/15/2017 0902   HGB 12.7 10/15/2017 0902   HCT 38.2 10/15/2017 0902   PLT 234 10/15/2017 0902   MCV 98.5 10/15/2017 0902   MCH 32.7 10/15/2017 0902   MCHC 33.2 10/15/2017 0902   RDW 13.4 10/15/2017 0902   LYMPHSABS 5.2 (H) 11/14/2010 1053   MONOABS 0.6 11/14/2010 1053   EOSABS 0.1 11/14/2010 1053   BASOSABS 0.0 11/14/2010 1053    No results found for: POCLITH, LITHIUM   No results found for: PHENYTOIN, PHENOBARB, VALPROATE, CBMZ   .res Assessment: Plan:    Bipolar I disorder, current or most recent episode depressed, in partial remission with rapid cycling ([place]) - Plan: Lurasidone HCl (LATUDA) 120 MG TABS, buPROPion (WELLBUTRIN XL) 150 MG 24 hr tablet  Insomnia due to mental condition  Nightmare   . We discussed History of bipolar disorder.  In the past She was concerned that if she cut Latuda dose she'd go on spending sprees.  This has happened unfortunately.  She does not appear overtly manic however.  And she has had no improvement in the anhedonia with the reduction in [place].   Looks more depressed DT anhedonia and other sx listed.   Another option add Wellbutrin.     Failed several other psych meds.  The best response has been with Latuda.  Discussed the weight gain problem.  She is working on that  with her primary care doctor and closely following her blood sugars.  Increase Latuda to 120 mg daily. Trial Wellbutrin XL 150 mg tablets 1 each AM for 3 weeks and if NR, then call and we'll increase  To 300 mg AM Disc risk of mania.  Discussed potential metabolic side effects associated with atypical antipsychotics, as well as potential risk for movement side effects. Advised pt to contact office if movement side effects occur.   Disc option of worry treatment.  She has real financial stressors.  Explained difference between and anxiety and normal worry.  Option of Buspar.  Disc in detail. She'll consider but we agree to defer.  This appointment was 25 minutes  FU 2 mos.  Lynder Parents, MD, DFAPA   Please see After Visit Summary for patient specific instructions.  No future appointments.  No orders of the defined types were placed in this encounter.     -------------------------------

## 2019-05-24 NOTE — Telephone Encounter (Signed)
appt today

## 2019-05-24 NOTE — Patient Instructions (Signed)
Increase Latuda to 120 mg daily.  Trial Wellbutrin XL 150 mg tablets 1 each AM for 3 weeks and if no response, then call and we'll increase  To 300 mg each AM

## 2019-05-24 NOTE — Telephone Encounter (Signed)
Refused because dosage changed to 120 mg daily

## 2019-06-16 ENCOUNTER — Other Ambulatory Visit: Payer: Self-pay | Admitting: Psychiatry

## 2019-06-16 DIAGNOSIS — F3175 Bipolar disorder, in partial remission, most recent episode depressed: Secondary | ICD-10-CM

## 2019-07-12 ENCOUNTER — Other Ambulatory Visit: Payer: Self-pay | Admitting: Psychiatry

## 2019-07-12 ENCOUNTER — Other Ambulatory Visit: Payer: Self-pay

## 2019-07-12 ENCOUNTER — Ambulatory Visit (INDEPENDENT_AMBULATORY_CARE_PROVIDER_SITE_OTHER): Payer: BC Managed Care – PPO | Admitting: Psychiatry

## 2019-07-12 ENCOUNTER — Encounter: Payer: Self-pay | Admitting: Psychiatry

## 2019-07-12 DIAGNOSIS — F411 Generalized anxiety disorder: Secondary | ICD-10-CM

## 2019-07-12 DIAGNOSIS — F5105 Insomnia due to other mental disorder: Secondary | ICD-10-CM

## 2019-07-12 DIAGNOSIS — F3175 Bipolar disorder, in partial remission, most recent episode depressed: Secondary | ICD-10-CM

## 2019-07-12 DIAGNOSIS — F515 Nightmare disorder: Secondary | ICD-10-CM | POA: Diagnosis not present

## 2019-07-12 MED ORDER — QUETIAPINE FUMARATE ER 200 MG PO TB24
ORAL_TABLET | ORAL | 1 refills | Status: DC
Start: 2019-07-12 — End: 2019-07-14

## 2019-07-12 NOTE — Progress Notes (Signed)
[name] VN:[id number] [birth year] 48 y.o.  Subjective:   Patient ID:  Rebecca Rice is a 48 y.o. (DOB [birth year]) female.  Chief Complaint:  Chief Complaint  Patient presents with  . Depression    med managment  . Follow-up    med managment  . Manic Behavior    spending    Depression        Associated symptoms include no decreased concentration, no fatigue and no suicidal ideas.   [name] presents to the office today for follow-up of bipolar depression.   When seen March 26, 2019.  The patient was complaining of flatness with some depression.  There was concern that the high dose Latuda may be flattening her somewhat and so it was reduced from 120 mg to 80 mg.  We discussed the risk of developing mania.  Last seen October 2020.  For anhedonic depression we made the following changes: Increase Latuda to 120 mg daily. Trial Wellbutrin XL 150 mg tablets 1 each AM  Increased spending continues. Mood swings worse.   More energy at times and roller coaster with mood, energy and activity.  Read a book which is progress.  Overactivity at times like yesterday but Sat couldn't get out of bed.  Cycles daily 1 day real up and next day worn out. Cycles with sleep as well. If up late surfing on internet to buy or reorganizing closet or cleaning.  Trouble finishing things.   Spending time with parents.  F has dementia. betterl anhedonia.  Trouble with glucose and cholesterol.   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  Anhedonia.  Less interest in reading.  Always enjoyed that.    Patient denies any recent difficulty with anxiety.  Patient denies difficulty with sleep initiation but some bad dreams awaking her 3-4 times weekly. For a couple of weeks.  No precipitant. Job going OK. No history of NM.  Sleep  With trazodone 200.   Denies appetite disturbance.  Patient reports that energy and motivation have been reduced.  Patient denies any  difficulty with concentration.  Patient denies any suicidal ideation.  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  Has sleep apnea.  Past Psychiatric M edication Trials: lithium, Seroquel worked but SE, VPA, carbamazepine risperidone, Latuda 120  Review of Systems:  Review of Systems  Constitutional: Negative for fatigue.  Endocrine: Positive for polyuria.  Neurological: Negative for tremors, weakness and light-headedness.  Psychiatric/Behavioral: Positive for depression. Negative for agitation, behavioral problems, confusion, decreased concentration, dysphoric mood, hallucinations, self-injury, sleep disturbance and suicidal ideas. The patient is not nervous/anxious and is not hyperactive.     Medications: I have reviewed the patient's current medications.  Current Outpatient Medications  Medication Sig Dispense Refill  . albuterol (PROVENTIL HFA;VENTOLIN HFA) 108 (90 BASE) MCG/ACT inhaler Inhale 2 puffs into the lungs every 6 (six) hours as needed for wheezing or shortness of breath.     [name] amoxicillin (AMOXIL) 500 MG capsule Take 2,000 mg by mouth once. Before dental procedures    . atorvastatin (LIPITOR) 20 MG tablet Take 20 mg by mouth daily.    . cetirizine (ZYRTEC) 10 MG tablet Take 10 mg by mouth daily as needed for allergies.     . cloNIDine (CATAPRES) 0.1 MG tablet Take 0.1 mg by mouth at bedtime.     [name] doxycycline (PERIOSTAT) 20 MG tablet Take 20 mg by mouth 2 (two) times daily.    . Empagliflozin (  JARDIANCE PO) Take 25 mg by mouth daily.     . hydrochlorothiazide (HYDRODIURIL) 25 MG tablet TAKE 1 TABLET BY MOUTH ONCE DAILY FOR 30 DAYS    . Lurasidone HCl (LATUDA) 120 MG TABS Take 1 tablet (120 mg total) by mouth every morning. 30 tablet 3  . OZEMPIC, 1 MG/DOSE, 2 MG/1.5ML SOPN INJECT 1MG  EVERY WEEK ON THE SAME DAY OF EACH WEEK IN THE ABDOMEN THIGHS OR UPPER ARM ROTATING INJECTION SITES    . QUEtiapine (SEROQUEL XR) 200 MG 24 hr tablet 1 in the evening for 5 days then 2 each evening  60 tablet 1  . telmisartan (MICARDIS) 80 MG tablet   6  . traZODone (DESYREL) 100 MG tablet TAKE 1 TO 2 TABLETS BY MOUTH AT BEDTIME AS NEEDED FOR INSOMNIA 180 tablet 0  . TRESIBA FLEXTOUCH 200 UNIT/ML SOPN Inject 80 Units into the skin every evening.   5  . VASCEPA 1 g CAPS TAKE TWO CAPSULES BY MOUTH TWICE EVERY DAY WITH FOOD SWALLOW WHOLE     No current facility-administered medications for this visit.     Medication Side Effects: 30# weight gain with the increase in the Latuda, appears stable.  Allergies:  Allergies  Allergen Reactions  . Decongestant Formula Other (See Comments)    TACHYCARDIA  . Grapefruit Flavor [Flavoring Agent] Other (See Comments)    DRUG INTERACTION WITH LATUDA  . Latex Rash  . Aspirin     UNSPECIFIED REASON Told not take by md years ago  . Nsaids     UNSPECIFIED REASON Told years ago by md not to take  . Adhesive [Tape] Rash  . Oxycodone Itching  . Tramadol Itching  . Vicodin [Hydrocodone-Acetaminophen] Itching    Past Medical History:  Diagnosis Date  . Anemia   . Anxiety   . Arthritis    knees, ankles, hips  . Asthma    daily and prn inhalers; triggered by environmental allergies  . Bipolar disorder ([place])   . Cancer [facility]) 2012   breast, left   . Diabetes mellitus    NIDDM  type2  . Elevated liver enzymes since age 48  . GERD (gastroesophageal reflux disease)    OTC as needed  . Heart murmur    states no known problems  . Hypertension    under control; has been on med. > 12 yrs.  . Sleep apnea    cpap  summer  2018  . TIA (transient ischemic attack) age 31   TIA vs. Bell's palsy(was undetermined) - no residual deficits  . Wears glasses     History reviewed. No pertinent family history.  Social History   Socioeconomic History  . Marital status: Single    Spouse name: Not on file  . Number of children: Not on file  . Years of education: Not on file  . Highest education level: Not on file  Occupational History  . Not on  file  Social Needs  . Financial resource strain: Not on file  . Food insecurity    Worry: Not on file    Inability: Not on file  . Transportation needs    Medical: Not on file    Non-medical: Not on file  Tobacco Use  . Smoking status: Never Smoker  . Smokeless tobacco: Never Used  Substance and Sexual Activity  . Alcohol use: Yes    Comment: rarely  . Drug use: No  . Sexual activity: Not on file  Lifestyle  . Physical activity  Days per week: Not on file    Minutes per session: Not on file  . Stress: Not on file  Relationships  . Social Herbalist on phone: Not on file    Gets together: Not on file    Attends religious service: Not on file    Active member of club or organization: Not on file    Attends meetings of clubs or organizations: Not on file    Relationship status: Not on file  . Intimate partner violence    Fear of current or ex partner: Not on file    Emotionally abused: Not on file    Physically abused: Not on file    Forced sexual activity: Not on file  Other Topics Concern  . Not on file  Social History Narrative  . Not on file    Past Medical History, Surgical history, Social history, and Family history were reviewed and updated as appropriate.   Please see review of systems for further details on the patient's review from today.   Objective:   Physical Exam:  There were no vitals taken for this visit.  Physical Exam Constitutional:      General: She is not in acute distress.    Appearance: She is well-developed. She is obese.  Musculoskeletal:        General: No deformity.  Neurological:     Mental Status: She is alert and oriented to person, place, and time.     Motor: No tremor.     Coordination: Coordination normal.     Gait: Gait normal.  Psychiatric:        Attention and Perception: She is attentive.        Mood and Affect: Mood is anxious and depressed. Affect is not labile, blunt, angry or inappropriate.         Speech: Speech normal.        Behavior: Behavior normal.        Thought Content: Thought content normal. Thought content does not include homicidal or suicidal ideation. Thought content does not include homicidal or suicidal plan.        Cognition and Memory: Cognition normal.        Judgment: Judgment normal.     Comments: Insight intact. Judgment fair about self care. No auditory or visual hallucinations. No delusions.  Less anhedonia but more manic cycling and anxiety.     Lab Review:     Component Value Date/Time   NA 134 (L) 10/18/2017 0506   K 4.2 10/18/2017 0506   CL 100 (L) 10/18/2017 0506   CO2 23 10/18/2017 0506   GLUCOSE 90 10/18/2017 0506   BUN 14 10/18/2017 0506   CREATININE 1.01 (H) 10/18/2017 0506   CALCIUM 8.6 (L) 10/18/2017 0506   PROT 7.1 10/15/2017 0902   ALBUMIN 3.6 10/15/2017 0902   AST 30 10/15/2017 0902   ALT 24 10/15/2017 0902   ALKPHOS 36 (L) 10/15/2017 0902   BILITOT 1.2 10/15/2017 0902   GFRNONAA >60 10/18/2017 0506   GFRAA >60 10/18/2017 0506       Component Value Date/Time   WBC 5.4 10/15/2017 0902   RBC 3.88 10/15/2017 0902   HGB 12.7 10/15/2017 0902   HCT 38.2 10/15/2017 0902   PLT 234 10/15/2017 0902   MCV 98.5 10/15/2017 0902   MCH 32.7 10/15/2017 0902   MCHC 33.2 10/15/2017 0902   RDW 13.4 10/15/2017 0902   LYMPHSABS 5.2 (H) 11/14/2010 1053   MONOABS 0.6  11/14/2010 1053   EOSABS 0.1 11/14/2010 1053   BASOSABS 0.0 11/14/2010 1053    No results found for: POCLITH, LITHIUM   No results found for: PHENYTOIN, PHENOBARB, VALPROATE, CBMZ   .res Assessment: Plan:    Bipolar I disorder, current or most recent episode depressed, in partial remission with rapid cycling ([place]) - Plan: QUEtiapine (SEROQUEL XR) 200 MG 24 hr tablet  Insomnia due to mental condition - Plan: QUEtiapine (SEROQUEL XR) 200 MG 24 hr tablet  Nightmare  Generalized anxiety disorder - Plan: QUEtiapine (SEROQUEL XR) 200 MG 24 hr tablet   . We discussed History  of bipolar disorder.  In the past She was concerned that if she cut Latuda dose she'd go on spending sprees.  This has happened unfortunately.  She does not appear overtly manic however.  And she has had no improvement in the anhedonia with the reduction in [place].   Looks more depressed DT anhedonia and other sx listed.   Another option add Wellbutrin.     Failed several other psych meds.  The best response has been with Latuda.  Discussed the weight gain problem.  She is working on that with her primary care doctor and closely following her blood sugars.  She wants to return to Seroquel which she took before at 400 mg daily bc she felt less flat and didn't have mood swings. stop bupropion  And trazodone  Start quetiapine XR 200 mg 1  tablet 2 to 3 hours before bedtime And reduce Latuda to one half of the 120 mg tablet for 5 days,   Then reduce Latuda to one half of an 80 mg tablet and increase quetiapine to 2 of the XR 200 mg tablets for 5 days,  Then stop Latuda  Option to return to Seroquel which worked before.  Mind was clearer and was less flat.    Discussed potential metabolic side effects associated with atypical antipsychotics, as well as potential risk for movement side effects. Advised pt to contact office if movement side effects occur.   Disc option of worry treatment.  She has real financial stressors.  Explained difference between and anxiety and normal worry.  Option of Buspar.  Disc in detail. She'll consider but we agree to defer.  FU 6 weeks.  Lynder Parents, MD, DFAPA   Please see After Visit Summary for patient specific instructions.  No future appointments.  No orders of the defined types were placed in this encounter.     -------------------------------

## 2019-07-12 NOTE — Patient Instructions (Signed)
stop bupropion  And trazodone  Start quetiapine XR 200 mg 1  tablet 2 to 3 hours before bedtime And reduce Latuda to one half of the 120 mg tablet for 5 days,   Then reduce Latuda to one half of an 80 mg tablet and increase quetiapine to 2 of the XR 200 mg tablets for 5 days,  Then stop [place]

## 2019-07-13 NOTE — Telephone Encounter (Signed)
Medication requiring PA, submitted today

## 2019-07-14 ENCOUNTER — Telehealth: Payer: Self-pay

## 2019-07-14 NOTE — Telephone Encounter (Signed)
Prior authorization submitted with cover my meds for Quetiapine ER 200 mg 2 capsules per day approved effective 07/13/2019-07/11/2022 Ref#[id number]

## 2019-08-05 ENCOUNTER — Other Ambulatory Visit: Payer: Self-pay | Admitting: Psychiatry

## 2019-08-05 DIAGNOSIS — F3175 Bipolar disorder, in partial remission, most recent episode depressed: Secondary | ICD-10-CM

## 2019-08-05 DIAGNOSIS — F411 Generalized anxiety disorder: Secondary | ICD-10-CM

## 2019-08-05 DIAGNOSIS — F5105 Insomnia due to other mental disorder: Secondary | ICD-10-CM

## 2019-08-24 ENCOUNTER — Encounter: Payer: Self-pay | Admitting: Psychiatry

## 2019-08-24 ENCOUNTER — Ambulatory Visit (INDEPENDENT_AMBULATORY_CARE_PROVIDER_SITE_OTHER): Payer: BC Managed Care – PPO | Admitting: Psychiatry

## 2019-08-24 ENCOUNTER — Other Ambulatory Visit: Payer: Self-pay

## 2019-08-24 DIAGNOSIS — F411 Generalized anxiety disorder: Secondary | ICD-10-CM | POA: Diagnosis not present

## 2019-08-24 DIAGNOSIS — F3175 Bipolar disorder, in partial remission, most recent episode depressed: Secondary | ICD-10-CM

## 2019-08-24 DIAGNOSIS — F5105 Insomnia due to other mental disorder: Secondary | ICD-10-CM | POA: Diagnosis not present

## 2019-08-24 MED ORDER — QUETIAPINE FUMARATE ER 400 MG PO TB24: 400.0000 mg | ORAL_TABLET | Freq: Every day | ORAL | 0 refills | Status: DC

## 2019-08-24 NOTE — Progress Notes (Signed)
Rebecca Rice [id number] [birth year] 49 y.o.  Subjective:   Patient ID:  Rebecca Rice is a 49 y.o. (DOB 1971/07/12) female.  Chief Complaint:  Chief Complaint  Patient presents with  . Follow-up    Medication Management and changes  . Depression    Medication Management  . Other    Bipolar 1    Depression        Associated symptoms include no decreased concentration, no fatigue and no suicidal ideas.   Rebecca Rice presents to the office today for follow-up of bipolar depression.   When seen March 26, 2019.  The patient was complaining of flatness with some depression.  There was concern that the high dose Latuda may be flattening her somewhat and so it was reduced from 120 mg to 80 mg.  We discussed the risk of developing mania.  seen October 2020.  For anhedonic depression we made the following changes: Increase Latuda to 120 mg daily. Trial Wellbutrin XL 150 mg tablets 1 each AM The following was noted: Increased spending continues. Mood swings worse.   More energy at times and roller coaster with mood, energy and activity.  Read a book which is progress.  Overactivity at times like yesterday but Sat couldn't get out of bed.  Cycles daily 1 day real up and next day worn out. Cycles with sleep as well. If up late surfing on internet to buy or reorganizing closet or cleaning.  Trouble finishing things.   Spending time with parents.  F has dementia. betterl anhedonia.  Last seen December 2020.  As noted she was mood cycling with some manic symptoms.  Because it was not being controlled by the Latuda the decision was made to switch back to Seroquel which had been helpful in the past.  The Latuda was weaned, trazodone and Wellbutrin were stopped, and Seroquel XR was added and increased to 400 mg every afternoon.  Pleased she has less spending.  Sometimes still a little too exuberant but not bad.  Others have noted her being "very  Happy".  Usually 7 -8 hours but work schedule  dependent.  No depression problems since here.  Surprising.  No SE so far.   Patient denies any recent difficulty with anxiety.  No further bad dreams.  Denies appetite disturbance.  Patient reports that energy and motivation have been reduced.  Patient denies any difficulty with concentration.  Patient denies any suicidal ideation.  No menses in 2 years but did this month.  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 Changed her mind and thinking of respiratory therapy school but it would take 2 years.  She's helping care for her parents.  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 and going to school.  She feels she needs to change career to make more money.  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  Has sleep apnea.  Past Psychiatric M edication Trials: lithium, Seroquel worked but SE, VPA, carbamazepine risperidone, Latuda 120 Wellbutrin mood cycling Trazodone  Review of Systems:  Review of Systems  Constitutional: Negative for fatigue.  Endocrine: Positive for polyuria.  Neurological: Negative for tremors, weakness and light-headedness.  Psychiatric/Behavioral: Positive for depression. Negative for agitation, behavioral problems, confusion, decreased concentration, dysphoric mood, hallucinations, self-injury, sleep disturbance and suicidal ideas. The patient is not nervous/anxious and is not hyperactive.     Medications: I have reviewed the patient's current medications.  Current Outpatient Medications  Medication Sig Dispense Refill  . albuterol (  PROVENTIL HFA;VENTOLIN HFA) 108 (90 BASE) MCG/ACT inhaler Inhale 2 puffs into the lungs every 6 (six) hours as needed for wheezing or shortness of breath.     [name] amoxicillin (AMOXIL) 500 MG capsule Take 2,000 mg by mouth once. Before dental procedures    . ASMANEX HFA 100 MCG/ACT AERO SMARTSIG:2 Puff(s) Via Inhaler Morning-Night    . atorvastatin (LIPITOR) 20 MG tablet Take 20 mg by mouth daily.    . cetirizine  (ZYRTEC) 10 MG tablet Take 10 mg by mouth daily as needed for allergies.     . cloNIDine (CATAPRES) 0.1 MG tablet Take 0.1 mg by mouth at bedtime.     . Empagliflozin (JARDIANCE PO) Take 25 mg by mouth daily.     . hydrochlorothiazide (HYDRODIURIL) 25 MG tablet TAKE 1 TABLET BY MOUTH ONCE DAILY FOR 30 DAYS    . Multiple Minerals-Vitamins (CALCIUM-MAGNESIUM-ZINC-D3 PO) Take by mouth.    . Multiple Vitamins-Minerals (ALIVE ONCE DAILY WOMENS PO) Take by mouth.    [name] OZEMPIC, 1 MG/DOSE, 2 MG/1.5ML SOPN INJECT 1MG  EVERY WEEK ON THE SAME DAY OF EACH WEEK IN THE ABDOMEN THIGHS OR UPPER ARM ROTATING INJECTION SITES    . QUEtiapine (SEROQUEL XR) 400 MG 24 hr tablet Take 1 tablet (400 mg total) by mouth daily. 90 tablet 0  . telmisartan (MICARDIS) 80 MG tablet   6  . TRESIBA FLEXTOUCH 200 UNIT/ML SOPN Inject 80 Units into the skin every evening.   5  . VASCEPA 1 g CAPS TAKE TWO CAPSULES BY MOUTH TWICE EVERY DAY WITH FOOD SWALLOW WHOLE     No current facility-administered medications for this visit.    Medication Side Effects: 30# weight gain with the increase in the Latuda.  No change so far with switch back to Seroquel XR.  Allergies:  Allergies  Allergen Reactions  . Decongestant Formula Other (See Comments)    TACHYCARDIA  . Grapefruit Flavor [Flavoring Agent] Other (See Comments)    DRUG INTERACTION WITH LATUDA  . Latex Rash  . Aspirin     UNSPECIFIED REASON Told not take by md years ago  . Nsaids     UNSPECIFIED REASON Told years ago by md not to take  . Adhesive [Tape] Rash  . Oxycodone Itching  . Tramadol Itching  . Vicodin [Hydrocodone-Acetaminophen] Itching    Past Medical History:  Diagnosis Date  . Anemia   . Anxiety   . Arthritis    knees, ankles, hips  . Asthma    daily and prn inhalers; triggered by environmental allergies  . Bipolar disorder ([place])   . Cancer [facility]) 2012   breast, left   . Diabetes mellitus    NIDDM  type2  . Elevated liver enzymes since age 77  .  GERD (gastroesophageal reflux disease)    OTC as needed  . Heart murmur    states no known problems  . Hypertension    under control; has been on med. > 12 yrs.  . Sleep apnea    cpap  summer  2018  . TIA (transient ischemic attack) age 54   TIA vs. Bell's palsy(was undetermined) - no residual deficits  . Wears glasses     History reviewed. No pertinent family history.  Social History   Socioeconomic History  . Marital status: Single    Spouse name: Not on file  . Number of children: Not on file  . Years of education: Not on file  . Highest education level: Not on file  Occupational History  . Not on file  Tobacco Use  . Smoking status: Never Smoker  . Smokeless tobacco: Never Used  Substance and Sexual Activity  . Alcohol use: Yes    Comment: rarely  . Drug use: No  . Sexual activity: Not on file  Other Topics Concern  . Not on file  Social History Narrative  . Not on file   Social Determinants of Health   Financial Resource Strain:   . Difficulty of Paying Living Expenses: Not on file  Food Insecurity:   . Worried About Charity fundraiser in the Last Year: Not on file  . Ran Out of Food in the Last Year: Not on file  Transportation Needs:   . Lack of Transportation (Medical): Not on file  . Lack of Transportation (Non-Medical): Not on file  Physical Activity:   . Days of Exercise per Week: Not on file  . Minutes of Exercise per Session: Not on file  Stress:   . Feeling of Stress : Not on file  Social Connections:   . Frequency of Communication with Friends and Family: Not on file  . Frequency of Social Gatherings with Friends and Family: Not on file  . Attends Religious Services: Not on file  . Active Member of Clubs or Organizations: Not on file  . Attends Archivist Meetings: Not on file  . Marital Status: Not on file  Intimate Partner Violence:   . Fear of Current or Ex-Partner: Not on file  . Emotionally Abused: Not on file  . Physically  Abused: Not on file  . Sexually Abused: Not on file    Past Medical History, Surgical history, Social history, and Family history were reviewed and updated as appropriate.   Please see review of systems for further details on the patient's review from today.   Objective:   Physical Exam:  There were no vitals taken for this visit.  Physical Exam Constitutional:      General: She is not in acute distress.    Appearance: She is well-developed. She is obese.  Musculoskeletal:        General: No deformity.  Neurological:     Mental Status: She is alert and oriented to person, place, and time.     Motor: No tremor.     Coordination: Coordination normal.     Gait: Gait normal.  Psychiatric:        Attention and Perception: She is attentive.        Mood and Affect: Mood is not anxious or depressed. Affect is not labile, blunt, angry or inappropriate.        Speech: Speech normal.        Behavior: Behavior normal.        Thought Content: Thought content normal. Thought content does not include homicidal or suicidal ideation. Thought content does not include homicidal or suicidal plan.        Cognition and Memory: Cognition normal.        Judgment: Judgment normal.     Comments: Insight intact. Judgment fair about self care. No auditory or visual hallucinations. No delusions.  No cycling.  Maybe a little hypomania at times but not problems with it.     Lab Review:     Component Value Date/Time   NA 134 (L) 10/18/2017 0506   K 4.2 10/18/2017 0506   CL 100 (L) 10/18/2017 0506   CO2 23 10/18/2017 0506   GLUCOSE 90 10/18/2017 0506  BUN 14 10/18/2017 0506   CREATININE 1.01 (H) 10/18/2017 0506   CALCIUM 8.6 (L) 10/18/2017 0506   PROT 7.1 10/15/2017 0902   ALBUMIN 3.6 10/15/2017 0902   AST 30 10/15/2017 0902   ALT 24 10/15/2017 0902   ALKPHOS 36 (L) 10/15/2017 0902   BILITOT 1.2 10/15/2017 0902   GFRNONAA >60 10/18/2017 0506   GFRAA >60 10/18/2017 0506       Component  Value Date/Time   WBC 5.4 10/15/2017 0902   RBC 3.88 10/15/2017 0902   HGB 12.7 10/15/2017 0902   HCT 38.2 10/15/2017 0902   PLT 234 10/15/2017 0902   MCV 98.5 10/15/2017 0902   MCH 32.7 10/15/2017 0902   MCHC 33.2 10/15/2017 0902   RDW 13.4 10/15/2017 0902   LYMPHSABS 5.2 (H) 11/14/2010 1053   MONOABS 0.6 11/14/2010 1053   EOSABS 0.1 11/14/2010 1053   BASOSABS 0.0 11/14/2010 1053    No results found for: POCLITH, LITHIUM   No results found for: PHENYTOIN, PHENOBARB, VALPROATE, CBMZ   .res Assessment: Plan:    Bipolar I disorder, current or most recent episode depressed, in partial remission with rapid cycling ([place]) - Plan: QUEtiapine (SEROQUEL XR) 400 MG 24 hr tablet  Insomnia due to mental condition - Plan: QUEtiapine (SEROQUEL XR) 400 MG 24 hr tablet  Generalized anxiety disorder - Plan: QUEtiapine (SEROQUEL XR) 400 MG 24 hr tablet  NM and insomnia resolved since switch to Seroquel XR  Greater than 50% of 30 min  face to face time with patient was spent on counseling and coordination of care. We discussed History of bipolar disorder. Feels less depressed and less flat with switch from Latuda to Seroquel XR 400mg  daily. Also anxiety is better also. She had maxed out will to 120 mg daily and was recently having some anhedonic depression.  Wellbutrin added caused mood cycling.  She was also having some excessive spending.  Although symptoms have resolved with the switch to Seroquel X are so far.   Continue quetiapine XR 400 mg daily bc of good response.    Consider increase if hypomania is continuing or start cycling.  Discussed potential metabolic side effects associated with atypical antipsychotics, as well as potential risk for movement side effects. Advised pt to contact office if movement side effects occur.  Discussed risk of weight gain with Seroquel is greater than the risk with Latuda which could have an adverse effect on her diabetes.  She needs closely monitor that and  try to guard against weight gain. Disc HGBA1C is 5.8. better.  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  She feels she needs to make more money and 3x 12-hour shifts would better suit her needs need to care for her parents than her current situation and that she could make enough money she could only work one job rather than having to work to current.  Discussed a way to mathematically list out pros and cons of continuing her current position versus pursuing more education and changing jobs.  Her decision does not appear to be manic.  Answered questions about Covid vaccination and current medication.  There does not appear to be any elevated risk with regard to getting vaccinated.  Having diabetes and obesity she is at increased risk if she gets Covid.  The vaccination seems to be the right decision.  No med change today   FU 8-10 weeks  Lynder Parents, MD, DFAPA   Please see After Visit Summary for patient specific instructions.  No future appointments.  No orders of the defined types were placed in this encounter.     -------------------------------

## 2019-09-15 ENCOUNTER — Other Ambulatory Visit: Payer: Self-pay | Admitting: Psychiatry

## 2019-09-15 DIAGNOSIS — F3175 Bipolar disorder, in partial remission, most recent episode depressed: Secondary | ICD-10-CM

## 2019-09-15 DIAGNOSIS — F5105 Insomnia due to other mental disorder: Secondary | ICD-10-CM

## 2019-09-15 DIAGNOSIS — F411 Generalized anxiety disorder: Secondary | ICD-10-CM

## 2019-10-03 ENCOUNTER — Encounter (HOSPITAL_COMMUNITY): Payer: Self-pay

## 2019-10-03 ENCOUNTER — Ambulatory Visit (HOSPITAL_COMMUNITY)
Admission: EM | Admit: 2019-10-03 | Discharge: 2019-10-03 | Disposition: A | Discharge status: Home / Self Care | Payer: Worker's Compensation | Attending: Family Medicine | Admitting: Family Medicine

## 2019-10-03 ENCOUNTER — Other Ambulatory Visit: Payer: Self-pay

## 2019-10-03 DIAGNOSIS — S39012A Strain of muscle, fascia and tendon of lower back, initial encounter: Secondary | ICD-10-CM

## 2019-10-03 MED ORDER — TIZANIDINE HCL 4 MG PO TABS
4.0000 mg | ORAL_TABLET | Freq: Four times a day (QID) | ORAL | 0 refills | Status: AC | PRN
Start: 2019-10-03 — End: ?

## 2019-10-03 MED ORDER — TRAMADOL HCL 50 MG PO TABS: 50.0000 mg | ORAL_TABLET | Freq: Four times a day (QID) | ORAL | 0 refills | Status: AC | PRN

## 2019-10-03 MED ORDER — PREDNISONE 20 MG PO TABS: 20.0000 mg | ORAL_TABLET | Freq: Two times a day (BID) | ORAL | 0 refills | Status: AC

## 2019-10-03 NOTE — ED Triage Notes (Signed)
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 Pt state she has back started hurting and has not stopped.

## 2019-10-03 NOTE — ED Provider Notes (Signed)
[place]    CSN: [id number] Arrival date & time: 10/03/19  1629      History   Chief Complaint Chief Complaint  Patient presents with  . Back Pain    HPI Rebecca Rice is a 49 y.o. female.   HPI  Patient states that she had to do some repetitive lifting yesterday.  While she was doing the lifting she developed some low back pain.  Today the back pain is worse.  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  She does not usually have to do repetitive lifting.  She does have a history of a back condition.  She has had a microdiscectomy a couple of years back.  Does not usually have back pain. Pain is across the low back.  Radiates up toward shoulder blade.  She feels very stiff.  No numbness or weakness into the legs.  No bowel or bladder complaints.  She has not taken any medicine for the pain. Patient has diabetes, hypertension, hyperlipidemia.  Depression.  Underlying asthma.  Past Medical History:  Diagnosis Date  . Anemia   . Anxiety   . Arthritis    knees, ankles, hips  . Asthma    daily and prn inhalers; triggered by environmental allergies  . Bipolar disorder ([place])   . Cancer [facility]) 2012   breast, left   . Diabetes mellitus    NIDDM  type2  . Elevated liver enzymes since age 57  . GERD (gastroesophageal reflux disease)    OTC as needed  . Heart murmur    states no known problems  . Hypertension    under control; has been on med. > 12 yrs.  . Sleep apnea    cpap  summer  2018  . TIA (transient ischemic attack) age 6   TIA vs. Bell's palsy(was undetermined) - no residual deficits  . Wears glasses     Patient Active Problem List   Diagnosis Date Noted  . Neck pain 01/28/2018  . HNP (herniated nucleus pulposus), lumbar 10/17/2017  . Acquired absence of breast and nipple 10/17/2011  . 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 BACK SURGERY  10/17/2017  . BREAST CAPSULECTOMY  WITH IMPLANT EXCHANGE  06/24/2012   Procedure: BREAST CAPSULECTOMY WITH IMPLANT EXCHANGE;  Surgeon: Theodoro Kos, DO;  Location: [place];  Service: Plastics;  Laterality: Bilateral;  removal of bilateral breast expanders capsulectomies and placement of implants    . BREAST IMPLANT EXCHANGE Bilateral 07/05/2013   Procedure: REMOVAL OF BILATERAL IMPLANTS REPLACEMENT OF BILATERAL IMPLANTS BILATERAL  ;  Surgeon: Cristine Polio, MD;  Location: [place];  Service: Plastics;  Laterality: Bilateral;  . BREAST RECONSTRUCTION  10/17/2011   Procedure: BREAST RECONSTRUCTION;  Surgeon: Theodoro Kos, DO;  Location: [place];  Service: Plastics;  Laterality: Bilateral;  bilateral breast reconstruction with expander and flex hd placement  . BREAST RECONSTRUCTION N/A 04/18/2014   Procedure: LOWER OF RIGHT INFRA MAMMARY FOLD/RIGHT AND LEFT NIPPLE AREOLA COMPLEX RECONSTRUCTION Additional Normal saline added to right breast implant;  Surgeon: Cristine Polio, MD;  Location: [place];  Service: Plastics;  Laterality: N/A;  . FOOT SURGERY Bilateral 01/2010; 1998  . LAPAROSCOPIC CHOLECYSTECTOMY SINGLE SITE WITH INTRAOPERATIVE CHOLANGIOGRAM N/A 12/29/2015   Procedure: LAPAROSCOPIC CHOLECYSTECTOMY SINGLE SITE WITH INTRA OP CHOLANGIOGRAM ;  Surgeon: Michael Boston, MD;  Location: [facility];  Service: General;  Laterality: N/A;  . LASIK Bilateral 2002  . LIVER BIOPSY N/A 12/29/2015   Procedure: NEEDLE CORE LIVER BIOPSY;  Surgeon: Michael Boston, MD;  Location: [facility];  Service: General;  Laterality: N/A;  . LUMBAR LAMINECTOMY/DECOMPRESSION MICRODISCECTOMY N/A 10/17/2017   Procedure: LEFT L5-S1 MICRODISCECTOMY;  Surgeon: Marybelle Killings, MD;  Location: [place];  Service: Orthopedics;  Laterality: N/A;  . MASTECTOMY  11/21/2010   bilat. simple mastect.lt snbx  . MASTOPEXY Bilateral 02/21/2014   Procedure: SCAR REVISION RIGHT AND LEFT BREAST;  Surgeon: Cristine Polio, MD;   Location: [place];  Service: Plastics;  Laterality: Bilateral;  . SCAR REVISION Bilateral 07/05/2013   Procedure: SCAR REVISION BILATERAL EXCISION OF SEVERE EXCESSIVE BREAST TISSUE ;  Surgeon: Cristine Polio, MD;  Location: [place];  Service: Plastics;  Laterality: Bilateral;  . WISDOM TOOTH EXTRACTION  age 34    OB History   No obstetric history on file.      Home Medications    Prior to Admission medications   Medication Sig Start Date End Date Taking? Authorizing Provider  albuterol (PROVENTIL HFA;VENTOLIN HFA) 108 (90 BASE) MCG/ACT inhaler Inhale 2 puffs into the lungs every 6 (six) hours as needed for wheezing or shortness of breath.     [provider]  amoxicillin (AMOXIL) 500 MG capsule Take 2,000 mg by mouth once. Before dental procedures    [provider]  [facility] HFA 100 MCG/ACT AERO SMARTSIG:2 Puff(s) Via Inhaler Morning-Night 08/09/19   [provider]  atorvastatin (LIPITOR) 20 MG tablet Take 20 mg by mouth daily.    [provider]  cetirizine (ZYRTEC) 10 MG tablet Take 10 mg by mouth daily as needed for allergies.     [provider]  cloNIDine (CATAPRES) 0.1 MG tablet Take 0.1 mg by mouth at bedtime.     [provider]  Empagliflozin (JARDIANCE PO) Take 25 mg by mouth daily.     [provider]  hydrochlorothiazide (HYDRODIURIL) 25 MG tablet TAKE 1 TABLET BY MOUTH ONCE DAILY FOR 30 DAYS 10/03/18   [provider]  JARDIANCE 25 MG TABS tablet Take 25 mg by mouth every morning. 09/02/19   [provider]  Multiple Minerals-Vitamins (CALCIUM-MAGNESIUM-ZINC-D3 PO) Take by mouth.    [provider]  Multiple Vitamins-Minerals (ALIVE ONCE DAILY WOMENS PO) Take by mouth.    [provider]  OZEMPIC, 1 MG/DOSE, 2 MG/1.5ML SOPN INJECT 1MG  EVERY WEEK ON THE SAME DAY OF EACH WEEK IN THE ABDOMEN THIGHS OR UPPER ARM ROTATING INJECTION SITES 10/03/18    [provider]  predniSONE (DELTASONE) 20 MG tablet Take 1 tablet (20 mg total) by mouth 2 (two) times daily with a meal. 10/03/19   Raylene Everts, MD  QUEtiapine (SEROQUEL XR) 400 MG 24 hr tablet TAKE 1 TABLET BY MOUTH EVERY DAY 09/15/19   Cottle, Billey Co., MD  telmisartan (MICARDIS) 80 MG tablet  10/24/17   [provider]  tiZANidine (ZANAFLEX) 4 MG tablet Take 1-2 tablets (4-8 mg total) by mouth every 6 (six) hours as needed for muscle spasms. 10/03/19   Raylene Everts, MD  traMADol (ULTRAM) 50 MG tablet Take 1 tablet (50 mg total) by mouth every 6 (six) hours as needed. 10/03/19   Raylene Everts, MD  TRESIBA FLEXTOUCH 200 UNIT/ML SOPN Inject 80 Units into the skin every evening.  09/02/17   [provider]  VASCEPA 1 g CAPS TAKE TWO CAPSULES BY MOUTH TWICE  EVERY DAY WITH FOOD SWALLOW WHOLE 01/08/19   [provider]  benazepril-hydrochlorthiazide (LOTENSIN HCT) 20-25 MG per tablet Take 1 tablet by mouth 2 (two) times daily.    10/14/11  [provider]  metoprolol (LOPRESSOR) 50 MG tablet Take 50 mg by mouth 2 (two) times daily.    10/14/11  [provider]  metoprolol (TOPROL-XL) 50 MG 24 hr tablet Take 50 mg by mouth daily.    10/14/11  [provider]    Family History History reviewed. No pertinent family history.  Social History Social History   Tobacco Use  . Smoking status: Never Smoker  . Smokeless tobacco: Never Used  Substance Use Topics  . Alcohol use: Yes    Comment: rarely  . Drug use: No     Allergies   Decongestant formula, Grapefruit flavor [flavoring agent], Latex, Aspirin, Nsaids, Adhesive [tape], Oxycodone, Tramadol, and Vicodin [hydrocodone-acetaminophen]   Review of Systems Review of Systems  Musculoskeletal: Positive for back pain.     Physical Exam Triage Vital Signs ED Triage Vitals  Enc Vitals Group     BP 10/03/19 1725 130/76     Pulse Rate 10/03/19 1725 94     Resp  10/03/19 1725 18     Temp 10/03/19 1725 99.3 F (37.4 C)     Temp Source 10/03/19 1725 Oral     SpO2 10/03/19 1725 100 %     Weight 10/03/19 1721 199 lb (90.3 kg)     Height --      Head Circumference --      Peak Flow --      Pain Score 10/03/19 1721 6     Pain Loc --      Pain Edu? --      Excl. in [place]? --    No data found.  Updated Vital Signs BP 130/76 (BP Location: Right Arm)   Pulse 94   Temp 99.3 F (37.4 C) (Oral)   Resp 18   Wt 90.3 kg   LMP 09/20/2019   SpO2 100%   BMI 36.40 kg/m     Physical Exam Constitutional:      General: She is not in acute distress.    Appearance: She is well-developed. She is obese. She is ill-appearing.     Comments: Guarded movements.  Appears uncomfortable  HENT:     Head: Normocephalic and atraumatic.     Mouth/Throat:     Comments: Mask in place Eyes:     Conjunctiva/sclera: Conjunctivae normal.     Pupils: Pupils are equal, round, and reactive to light.  Cardiovascular:     Rate and Rhythm: Normal rate.  Pulmonary:     Effort: Pulmonary effort is normal. No respiratory distress.     Breath sounds: Normal breath sounds.  Musculoskeletal:        General: Normal range of motion.     Cervical back: Normal range of motion and neck supple.     Comments: Well-healed lower lumbar scar L5-S1.  Bilateral tenderness in the lumbar musculature.  Limited movement in lumbar spine.  Lower extremity exam is symmetric with negative straight leg raise  Skin:    General: Skin is warm and dry.  Neurological:     General: No focal deficit present.     Mental Status: She is alert.     Motor: No weakness.     Coordination: Coordination normal.     Deep Tendon Reflexes: Reflexes normal.  Psychiatric:  Mood and Affect: Mood normal.        Behavior: Behavior normal.      UC Treatments / Results  Labs (all labs ordered are listed, but only abnormal results are displayed) Labs Reviewed - No data to display  EKG   Radiology No  results found.  Procedures Procedures (including critical care time)  Medications Ordered in UC Medications - No data to display  Initial Impression / Assessment and Plan / UC Course  I have reviewed the triage vital signs and the nursing notes.  Pertinent labs & imaging results that were available during my care of the patient were reviewed by me and considered in my medical decision making (see chart for details).     Patient has musculoligamentous back pain.  No evidence of disc or radiculopathy.  Will treat conservatively. Final Clinical Impressions(s) / UC Diagnoses   Final diagnoses:  Lumbar strain, initial encounter     Discharge Instructions     Take the prednisone as directed.  Take 2 doses today. Watch your sugar while you are on the prednisone Take the muscle relaxer as needed.  This is useful to take at bedtime. Take tramadol if needed for pain.  I know this is caused itching in the past, but you can take it with the Benadryl and the itching will be controlled.  Itching is not an allergy, it is a side effect Ice or heat to area Avoid heavy lifting, activity as tolerated Follow-up with your Worker's Comp. provider next week   ED Prescriptions    Medication Sig Dispense Auth. Provider   predniSONE (DELTASONE) 20 MG tablet Take 1 tablet (20 mg total) by mouth 2 (two) times daily with a meal. 10 tablet Raylene Everts, MD   tiZANidine (ZANAFLEX) 4 MG tablet Take 1-2 tablets (4-8 mg total) by mouth every 6 (six) hours as needed for muscle spasms. 21 tablet Raylene Everts, MD   traMADol (ULTRAM) 50 MG tablet Take 1 tablet (50 mg total) by mouth every 6 (six) hours as needed. 15 tablet Raylene Everts, MD     I have reviewed the PDMP during this encounter.   Raylene Everts, MD 10/03/19 [phone]

## 2019-10-03 NOTE — Discharge Instructions (Signed)
Take the prednisone as directed.  Take 2 doses today. Watch your sugar while you are on the prednisone Take the muscle relaxer as needed.  This is useful to take at bedtime. Take tramadol if needed for pain.  I know this is caused itching in the past, but you can take it with the Benadryl and the itching will be controlled.  Itching is not an allergy, it is a side effect Ice or heat to area Avoid heavy lifting, activity as tolerated Follow-up with your Worker's Comp. provider next week

## 2019-10-18 ENCOUNTER — Ambulatory Visit: Payer: Worker's Compensation

## 2019-10-18 ENCOUNTER — Other Ambulatory Visit: Payer: Self-pay | Admitting: Family Medicine

## 2019-10-18 ENCOUNTER — Other Ambulatory Visit: Payer: Self-pay

## 2019-10-18 DIAGNOSIS — M545 Low back pain, unspecified: Secondary | ICD-10-CM

## 2019-11-02 ENCOUNTER — Encounter: Payer: Self-pay | Admitting: Psychiatry

## 2019-11-02 ENCOUNTER — Ambulatory Visit (INDEPENDENT_AMBULATORY_CARE_PROVIDER_SITE_OTHER): Payer: BC Managed Care – PPO | Admitting: Psychiatry

## 2019-11-02 ENCOUNTER — Other Ambulatory Visit: Payer: Self-pay

## 2019-11-02 DIAGNOSIS — F5105 Insomnia due to other mental disorder: Secondary | ICD-10-CM | POA: Diagnosis not present

## 2019-11-02 DIAGNOSIS — F3175 Bipolar disorder, in partial remission, most recent episode depressed: Secondary | ICD-10-CM

## 2019-11-02 DIAGNOSIS — F411 Generalized anxiety disorder: Secondary | ICD-10-CM | POA: Diagnosis not present

## 2019-11-02 NOTE — Progress Notes (Signed)
Rebecca Rice [id number] June 22, 1971 49 y.o.  Subjective:   Patient ID:  Rebecca Rice is a 50 y.o. (DOB [birth year]) female.  Chief Complaint:  Chief Complaint  Patient presents with  . Follow-up    Mood, anxiety, sleep and psych meds    Depression        Associated symptoms include no decreased concentration, no fatigue and no suicidal ideas.   ROLENE GORES presents to the office today for follow-up of bipolar depression.   When seen March 26, 2019.  The patient was complaining of flatness with some depression.  There was concern that the high dose Latuda may be flattening her somewhat and so it was reduced from 120 mg to 80 mg.  We discussed the risk of developing mania.  seen October 2020.  For anhedonic depression we made the following changes: Increase Latuda to 120 mg daily. Trial Wellbutrin XL 150 mg tablets 1 each AM The following was noted: Increased spending continues. Mood swings worse.   More energy at times and roller coaster with mood, energy and activity.  Read a book which is progress.  Overactivity at times like yesterday but Sat couldn't get out of bed.  Cycles daily 1 day real up and next day worn out. Cycles with sleep as well. If up late surfing on internet to buy or reorganizing closet or cleaning.  Trouble finishing things.   Spending time with parents.  F has dementia. betterl anhedonia.  seen December 2020.  As noted she was mood cycling with some manic symptoms.  Because it was not being controlled by the Latuda the decision was made to switch back to Seroquel which had been helpful in the past.  The Latuda was weaned, trazodone and Wellbutrin were stopped, and Seroquel XR was added and increased to 400 mg every afternoon. Pleased she has less spending.  Sometimes still a little too exuberant but not bad.  Others have noted her being "very  Happy".  Usually 7 -8 hours but work schedule dependent.  No depression problems since here.  Surprising.  No SE so  far.  Last seen January 2020.  She was doing relatively well and no meds were changed.  As of March 30.  Emotionally OK despite stress.  Seroquel is doing it's job so no complaints. Not signifcantly depressed.  Sleep OK usually except when stressed.  Big stressor dealing with parents who both have Alzheimer's.  Needs to be guardian and have them placed.  Patient denies any recent difficulty with anxiety.  No further bad dreams.  Denies appetite disturbance.  Patient reports that energy and motivation have been reduced.  Patient denies any difficulty with concentration.  Patient denies any suicidal ideation.  No menses in 2 years but did this month.  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 Changed her mind and thinking of respiratory therapy school but it would take 2 years.  She's helping care for her parents.  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 and going to school.  She feels she needs to change career to make more money.  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 Good grades in school.  Has sleep apnea.  Past Psychiatric M edication Trials: lithium, Seroquel worked but SE, VPA, carbamazepine risperidone, Latuda 120 Wellbutrin mood cycling Trazodone  Review of Systems:  Review of Systems  Constitutional: Negative for fatigue.  Endocrine: Negative for polyuria.  Neurological: Negative for tremors, weakness and light-headedness.  Psychiatric/Behavioral: Positive for  depression. Negative for agitation, behavioral problems, confusion, decreased concentration, dysphoric mood, hallucinations, self-injury, sleep disturbance and suicidal ideas. The patient is not nervous/anxious and is not hyperactive.     Medications: I have reviewed the patient's current medications.  Current Outpatient Medications  Medication Sig Dispense Refill  . albuterol (PROVENTIL HFA;VENTOLIN HFA) 108 (90 BASE) MCG/ACT inhaler Inhale 2 puffs into the lungs every 6 (six) hours as  needed for wheezing or shortness of breath.     [name] amoxicillin (AMOXIL) 500 MG capsule Take 2,000 mg by mouth once. Before dental procedures    . ASMANEX HFA 100 MCG/ACT AERO SMARTSIG:2 Puff(s) Via Inhaler Morning-Night    . atorvastatin (LIPITOR) 20 MG tablet Take 20 mg by mouth daily.    . cetirizine (ZYRTEC) 10 MG tablet Take 10 mg by mouth daily as needed for allergies.     . cloNIDine (CATAPRES) 0.1 MG tablet Take 0.1 mg by mouth at bedtime.     . Empagliflozin (JARDIANCE PO) Take 25 mg by mouth daily.     . hydrochlorothiazide (HYDRODIURIL) 25 MG tablet TAKE 1 TABLET BY MOUTH ONCE DAILY FOR 30 DAYS    . JARDIANCE 25 MG TABS tablet Take 25 mg by mouth every morning.    . Multiple Minerals-Vitamins (CALCIUM-MAGNESIUM-ZINC-D3 PO) Take by mouth.    . Multiple Vitamins-Minerals (ALIVE ONCE DAILY WOMENS PO) Take by mouth.    [name] OZEMPIC, 1 MG/DOSE, 2 MG/1.5ML SOPN INJECT 1MG  EVERY WEEK ON THE SAME DAY OF EACH WEEK IN THE ABDOMEN THIGHS OR UPPER ARM ROTATING INJECTION SITES    . QUEtiapine (SEROQUEL XR) 400 MG 24 hr tablet TAKE 1 TABLET BY MOUTH EVERY DAY 90 tablet 1  . telmisartan (MICARDIS) 80 MG tablet   6  . TRESIBA FLEXTOUCH 200 UNIT/ML SOPN Inject 80 Units into the skin every evening.   5  . VASCEPA 1 g CAPS TAKE TWO CAPSULES BY MOUTH TWICE EVERY DAY WITH FOOD SWALLOW WHOLE    . predniSONE (DELTASONE) 20 MG tablet Take 1 tablet (20 mg total) by mouth 2 (two) times daily with a meal. (Patient not taking: Reported on 11/02/2019) 10 tablet 0  . tiZANidine (ZANAFLEX) 4 MG tablet Take 1-2 tablets (4-8 mg total) by mouth every 6 (six) hours as needed for muscle spasms. (Patient not taking: Reported on 11/02/2019) 21 tablet 0  . traMADol (ULTRAM) 50 MG tablet Take 1 tablet (50 mg total) by mouth every 6 (six) hours as needed. (Patient not taking: Reported on 11/02/2019) 15 tablet 0   No current facility-administered medications for this visit.    Medication Side Effects: 30# weight gain with the  increase in the Latuda.  No change so far with switch back to Seroquel XR.  Allergies:  Allergies  Allergen Reactions  . Decongestant Formula Other (See Comments)    TACHYCARDIA  . Grapefruit Flavor [Flavoring Agent] Other (See Comments)    DRUG INTERACTION WITH LATUDA  . Latex Rash  . Aspirin     UNSPECIFIED REASON Told not take by md years ago  . Nsaids     UNSPECIFIED REASON Told years ago by md not to take  . Adhesive [Tape] Rash  . Oxycodone Itching  . Tramadol Itching  . Vicodin [Hydrocodone-Acetaminophen] Itching    Past Medical History:  Diagnosis Date  . Anemia   . Anxiety   . Arthritis    knees, ankles, hips  . Asthma    daily and prn inhalers; triggered by environmental allergies  . Bipolar  disorder ([place])   . Cancer [facility]) 2012   breast, left   . Diabetes mellitus    NIDDM  type2  . Elevated liver enzymes since age 74  . GERD (gastroesophageal reflux disease)    OTC as needed  . Heart murmur    states no known problems  . Hypertension    under control; has been on med. > 12 yrs.  . Sleep apnea    cpap  summer  2018  . TIA (transient ischemic attack) age 56   TIA vs. Bell's palsy(was undetermined) - no residual deficits  . Wears glasses     History reviewed. No pertinent family history.  Social History   Socioeconomic History  . Marital status: Single    Spouse name: Not on file  . Number of children: Not on file  . Years of education: Not on file  . Highest education level: Not on file  Occupational History  . Not on file  Tobacco Use  . Smoking status: Never Smoker  . Smokeless tobacco: Never Used  Substance and Sexual Activity  . Alcohol use: Yes    Comment: rarely  . Drug use: No  . Sexual activity: Not on file  Other Topics Concern  . Not on file  Social History Narrative  . Not on file   Social Determinants of Health   Financial Resource Strain:   . Difficulty of Paying Living Expenses:   Food Insecurity:   . Worried About  Charity fundraiser in the Last Year:   . Arboriculturist in the Last Year:   Transportation Needs:   . Film/video editor (Medical):   [name] Lack of Transportation (Non-Medical):   Physical Activity:   . Days of Exercise per Week:   . Minutes of Exercise per Session:   Stress:   . Feeling of Stress :   Social Connections:   . Frequency of Communication with Friends and Family:   . Frequency of Social Gatherings with Friends and Family:   . Attends Religious Services:   . Active Member of Clubs or Organizations:   . Attends Archivist Meetings:   [name] Marital Status:   Intimate Partner Violence:   . Fear of Current or Ex-Partner:   . Emotionally Abused:   [name] Physically Abused:   . Sexually Abused:     Past Medical History, Surgical history, Social history, and Family history were reviewed and updated as appropriate.   Please see review of systems for further details on the patient's review from today.   Objective:   Physical Exam:  LMP 10/18/2019   Physical Exam Constitutional:      General: She is not in acute distress.    Appearance: She is well-developed. She is obese.  Musculoskeletal:        General: No deformity.  Neurological:     Mental Status: She is alert and oriented to person, place, and time.     Motor: No tremor.     Coordination: Coordination normal.     Gait: Gait normal.  Psychiatric:        Attention and Perception: She is attentive.        Mood and Affect: Mood is not anxious or depressed. Affect is not labile, blunt, angry or inappropriate.        Speech: Speech normal.        Behavior: Behavior normal.        Thought Content: Thought content normal. Thought content  does not include homicidal or suicidal ideation. Thought content does not include homicidal or suicidal plan.        Cognition and Memory: Cognition normal.        Judgment: Judgment normal.     Comments: Insight intact. Judgment fair about self care. No auditory or visual  hallucinations. No delusions.  No cycling.  Maybe a little hypomania at times but not problems with it.     Lab Review:     Component Value Date/Time   NA 134 (L) 10/18/2017 0506   K 4.2 10/18/2017 0506   CL 100 (L) 10/18/2017 0506   CO2 23 10/18/2017 0506   GLUCOSE 90 10/18/2017 0506   BUN 14 10/18/2017 0506   CREATININE 1.01 (H) 10/18/2017 0506   CALCIUM 8.6 (L) 10/18/2017 0506   PROT 7.1 10/15/2017 0902   ALBUMIN 3.6 10/15/2017 0902   AST 30 10/15/2017 0902   ALT 24 10/15/2017 0902   ALKPHOS 36 (L) 10/15/2017 0902   BILITOT 1.2 10/15/2017 0902   GFRNONAA >60 10/18/2017 0506   GFRAA >60 10/18/2017 0506       Component Value Date/Time   WBC 5.4 10/15/2017 0902   RBC 3.88 10/15/2017 0902   HGB 12.7 10/15/2017 0902   HCT 38.2 10/15/2017 0902   PLT 234 10/15/2017 0902   MCV 98.5 10/15/2017 0902   MCH 32.7 10/15/2017 0902   MCHC 33.2 10/15/2017 0902   RDW 13.4 10/15/2017 0902   LYMPHSABS 5.2 (H) 11/14/2010 1053   MONOABS 0.6 11/14/2010 1053   EOSABS 0.1 11/14/2010 1053   BASOSABS 0.0 11/14/2010 1053    No results found for: POCLITH, LITHIUM   No results found for: PHENYTOIN, PHENOBARB, VALPROATE, CBMZ   .res Assessment: Plan:    Bipolar I disorder, current or most recent episode depressed, in partial remission with rapid cycling ([place])  Generalized anxiety disorder  Insomnia due to mental condition  NM and insomnia resolved since switch to Seroquel XR  Greater than 50% of 30 min  face to face time with patient was spent on counseling and coordination of care. We discussed History of bipolar disorder. Feels less depressed and less flat with switch from Latuda to Seroquel XR 400mg  daily. Also anxiety is better also. She had maxed out Latuda 120 mg daily and was recently having some anhedonic depression.  Wellbutrin added caused mood cycling.  She was also having some excessive spending.  Although symptoms have resolved with the switch to Seroquel X are so far.    Continue quetiapine XR 400 mg daily bc of good response.    Discussed potential metabolic side effects associated with atypical antipsychotics, as well as potential risk for movement side effects. Advised pt to contact office if movement side effects occur.  Discussed risk of weight gain with Seroquel is greater than the risk with Latuda which could have an adverse effect on her diabetes.  She needs closely monitor that and try to guard against weight gain.  She has not gained weight on it. Disc HGBA1C is 4.8. better.  No med change today   FU 3 mos  Lynder Parents, MD, DFAPA   Please see After Visit Summary for patient specific instructions.  No future appointments.  No orders of the defined types were placed in this encounter.     -------------------------------

## 2019-11-09 ENCOUNTER — Other Ambulatory Visit: Payer: Self-pay | Admitting: Psychiatry

## 2019-11-09 DIAGNOSIS — F3175 Bipolar disorder, in partial remission, most recent episode depressed: Secondary | ICD-10-CM

## 2019-11-09 DIAGNOSIS — F411 Generalized anxiety disorder: Secondary | ICD-10-CM

## 2019-11-09 DIAGNOSIS — F5105 Insomnia due to other mental disorder: Secondary | ICD-10-CM

## 2019-12-13 ENCOUNTER — Ambulatory Visit (INDEPENDENT_AMBULATORY_CARE_PROVIDER_SITE_OTHER): Payer: BC Managed Care – PPO

## 2019-12-13 ENCOUNTER — Ambulatory Visit (INDEPENDENT_AMBULATORY_CARE_PROVIDER_SITE_OTHER): Payer: BC Managed Care – PPO | Admitting: Podiatrist

## 2019-12-13 ENCOUNTER — Other Ambulatory Visit: Payer: Self-pay

## 2019-12-13 ENCOUNTER — Encounter: Payer: Self-pay | Admitting: Podiatrist

## 2019-12-13 DIAGNOSIS — M79671 Pain in right foot: Secondary | ICD-10-CM

## 2019-12-13 DIAGNOSIS — M79672 Pain in left foot: Secondary | ICD-10-CM | POA: Diagnosis not present

## 2019-12-13 DIAGNOSIS — M7752 Other enthesopathy of left foot: Secondary | ICD-10-CM

## 2019-12-13 DIAGNOSIS — L84 Corns and callosities: Secondary | ICD-10-CM

## 2019-12-13 NOTE — Patient Instructions (Signed)

## 2019-12-13 NOTE — Progress Notes (Signed)
    Chief Complaint  Patient presents with  . Foot Pain    pt is here for bil toenail pain of both 5th toenails, pt states that it is painful for about 2-3 months, pt also states that right 5th toe has a callus/ corn that needs to be looked at.     HPI: Patient is 49 y.o. female who presents today for the concerns as listed above.    Review of Systems No fevers, chills, nausea, muscle aches, no difficulty breathing, no calf pain, no chest pain or shortness of breath.   Physical Exam  GENERAL APPEARANCE: Alert, conversant. Appropriately groomed. No acute distress.   VASCULAR: Pedal pulses palpable DP and PT bilateral.  Capillary refill time is immediate to all digits,  Proximal to distal cooling it warm to warm.  Digital hair growth is present bilateral   NEUROLOGIC: sensation is intact epicritically and protectively to 5.07 monofilament at 5/5 sites bilateral.  Light touch is intact bilateral, vibratory sensation intact bilateral, achilles tendon reflex is intact bilateral.   MUSCULOSKELETAL: acceptable muscle strength, tone and stability bilateral.  No gross boney pedal deformities noted.  No pain, crepitus or limitation noted with foot and ankle range of motion bilateral.  Painful fifth digits bilateral.  Pain at the proximal interphalangeal joint of the left is noted compared to the right.  DERMATOLOGIC: skin is warm, supple, and dry.  No open lesions noted.  No rash, no pre ulcerative lesions. Digital nails are asymptomatic.  Well-circumscribed callus present on the lateral aspect of the right fifth digit which is painful with pressure.  Heloma molle is also present on the left interspace between the fourth and fifth digits.  It is painful with pressure.  X-rays: 3 views bilateral feet are obtained.  Previous arthroplasty of the proximal phalangeal joint appears to have healed nicely on both fifth digits.  Remainder of x-rays unremarkable.  No sign of fracture or dislocation  noted.    Assessment     ICD-10-CM   1. Foot pain, bilateral  M79.671 CANCELED: DG Foot Complete Right   M79.672 CANCELED: DG Foot Complete Left  2. Corns and callosities  L84   3. Heloma molle  L84   4. Capsulitis of toe of left foot  M77.52       Plan  Discussed treatment options and alternatives debrided the corn on the lateral aspect of the right fifth digit as well as a heloma molle without complication.  Also injected the left fifth PIP joint with Kenalog and Marcaine mixture in hopes this would help with the pain she is experiencing within the joint.  Should these treatments fail to her relieve her pain she she will call.  Otherwise she will be seen back as needed.

## 2019-12-14 ENCOUNTER — Other Ambulatory Visit: Payer: Self-pay | Admitting: Podiatrist

## 2019-12-14 DIAGNOSIS — L84 Corns and callosities: Secondary | ICD-10-CM

## 2020-02-01 ENCOUNTER — Ambulatory Visit: Payer: BC Managed Care – PPO | Admitting: Psychiatry

## 2020-03-13 ENCOUNTER — Ambulatory Visit: Payer: BC Managed Care – PPO | Admitting: Psychiatry

## 2020-03-15 ENCOUNTER — Encounter: Payer: Self-pay | Admitting: Genetic Counselor

## 2020-04-13 ENCOUNTER — Other Ambulatory Visit: Payer: Self-pay | Admitting: Orthopedic Surgery

## 2020-04-13 DIAGNOSIS — M25511 Pain in right shoulder: Secondary | ICD-10-CM

## 2020-05-04 ENCOUNTER — Ambulatory Visit
Admission: RE | Admit: 2020-05-04 | Discharge: 2020-05-04 | Disposition: A | Discharge status: Home / Self Care | Payer: BC Managed Care – PPO | Source: Ambulatory Visit | Attending: Orthopedic Surgery | Admitting: Orthopedic Surgery

## 2020-05-04 ENCOUNTER — Other Ambulatory Visit: Payer: Self-pay

## 2020-05-04 DIAGNOSIS — M25511 Pain in right shoulder: Secondary | ICD-10-CM

## 2020-05-16 ENCOUNTER — Ambulatory Visit (INDEPENDENT_AMBULATORY_CARE_PROVIDER_SITE_OTHER): Payer: BC Managed Care – PPO | Admitting: Psychiatry

## 2020-05-16 ENCOUNTER — Other Ambulatory Visit: Payer: Self-pay

## 2020-05-16 ENCOUNTER — Encounter: Payer: Self-pay | Admitting: Psychiatry

## 2020-05-16 DIAGNOSIS — F3175 Bipolar disorder, in partial remission, most recent episode depressed: Secondary | ICD-10-CM

## 2020-05-16 DIAGNOSIS — F411 Generalized anxiety disorder: Secondary | ICD-10-CM | POA: Diagnosis not present

## 2020-05-16 DIAGNOSIS — Z636 Dependent relative needing care at home: Secondary | ICD-10-CM

## 2020-05-16 DIAGNOSIS — F5105 Insomnia due to other mental disorder: Secondary | ICD-10-CM | POA: Diagnosis not present

## 2020-05-16 DIAGNOSIS — F515 Nightmare disorder: Secondary | ICD-10-CM | POA: Diagnosis not present

## 2020-05-16 MED ORDER — LORAZEPAM 1 MG PO TABS: 1.0000 mg | ORAL_TABLET | Freq: Three times a day (TID) | ORAL | 0 refills | Status: AC

## 2020-05-16 MED ORDER — QUETIAPINE FUMARATE ER 400 MG PO TB24: 400.0000 mg | ORAL_TABLET | Freq: Every day | ORAL | 1 refills | Status: DC

## 2020-05-16 NOTE — Progress Notes (Signed)
Rebecca Rice [id number] 05-25-1971 49 y.o.  Subjective:   Patient ID:  Rebecca Rice is a 48 y.o. (DOB 1971-06-27) female.  Chief Complaint:  Chief Complaint  Patient presents with  . Follow-up  . Anxiety  . Stress    F Alzh caregiver stress    Depression        Associated symptoms include no decreased concentration, no fatigue and no suicidal ideas.   Rebecca Rice presents to the office today for follow-up of bipolar depression.   When seen March 26, 2019.  The patient was complaining of flatness with some depression.  There was concern that the high dose Latuda may be flattening her somewhat and so it was reduced from 120 mg to 80 mg.  We discussed the risk of developing mania.  seen October 2020.  For anhedonic depression we made the following changes: Increase Latuda to 120 mg daily. Trial Wellbutrin XL 150 mg tablets 1 each AM The following was noted: Increased spending continues. Mood swings worse.   More energy at times and roller coaster with mood, energy and activity.  Read a book which is progress.  Overactivity at times like yesterday but Sat couldn't get out of bed.  Cycles daily 1 day real up and next day worn out. Cycles with sleep as well. If up late surfing on internet to buy or reorganizing closet or cleaning.  Trouble finishing things.   Spending time with parents.  F has dementia. betterl anhedonia.  seen December 2020.  As noted she was mood cycling with some manic symptoms.  Because it was not being controlled by the Latuda the decision was made to switch back to Seroquel which had been helpful in the past.  The Latuda was weaned, trazodone and Wellbutrin were stopped, and Seroquel XR was added and increased to 400 mg every afternoon. Pleased she has less spending.  Sometimes still a little too exuberant but not bad.  Others have noted her being "very  Happy".  Usually 7 -8 hours but work schedule dependent.  No depression problems since here.  Surprising.  No SE  so far.  Last seen January 2020.  She was doing relatively well and no meds were changed.  As of March 30.  Emotionally OK despite stress.  Seroquel is doing it's job so no complaints. Not signifcantly depressed.  Sleep OK usually except when stressed.  Big stressor dealing with parents who both have Alzheimer's.  Needs to be guardian and have them placed.  Patient denies any recent difficulty with anxiety.  No further bad dreams.  Denies appetite disturbance.  Patient reports that energy and motivation have been reduced.  Patient denies any difficulty with concentration.  Patient denies any suicidal ideation. No menses in 2 years but did this month. 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 Changed her mind and thinking of respiratory therapy school but it would take 2 years.  She's helping care for her parents.  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 and going to school.  She feels she needs to change career to make more money.  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 Good grades in school. Plan: no med changes  05/16/2020 appointment with the following noted: 5/14 Mo had SZ and father has dementia and SZ lasted 2 hours. MI and then passed away.  She's only child so had to quit work and care for father.  She's only child.  No one to care for  father.  Has open worker's comp case for back injury.  Can't get help with father bc of Dentist.  Has to do repairs on the house.  Looking on work from home job. More stressed than  Depressed.  Doesn't have the money to fix her house and couldn't sell it without the repairs. Has to try to get guardianship for father and doesn't have the money. Can't say Seroquel not working.  Father's sleep is erratic therefore her sleep is erratic.  He has a day care which is hard to get him to.   He thinks pt is his wife. A lot of anger that parents didn't prepare for this when she asked them to do it. Trying Aswaganda to help with  anxiety and seems to help. Lost from 221# to 182# over a year.   Has sleep apnea. No history of addiction to drugs.  Past Psychiatric M edication Trials: lithium, Seroquel worked but SE, VPA, carbamazepine risperidone, Latuda 120 Wellbutrin mood cycling Trazodone  Review of Systems:  Review of Systems  Constitutional: Negative for fatigue.  Cardiovascular: Negative for palpitations.  Endocrine: Negative for polyuria.  Neurological: Negative for tremors, weakness and light-headedness.  Psychiatric/Behavioral: Positive for depression. Negative for agitation, behavioral problems, confusion, decreased concentration, dysphoric mood, hallucinations, self-injury, sleep disturbance and suicidal ideas. The patient is not nervous/anxious and is not hyperactive.     Medications: I have reviewed the patient's current medications.  Current Outpatient Medications  Medication Sig Dispense Refill  . albuterol (PROVENTIL HFA;VENTOLIN HFA) 108 (90 BASE) MCG/ACT inhaler Inhale 2 puffs into the lungs every 6 (six) hours as needed for wheezing or shortness of breath.     [name] amoxicillin (AMOXIL) 500 MG capsule Take 2,000 mg by mouth once. Before dental procedures    . ASMANEX HFA 100 MCG/ACT AERO SMARTSIG:2 Puff(s) Via Inhaler Morning-Night    . atorvastatin (LIPITOR) 10 MG tablet Take 10 mg by mouth daily.    [name] atorvastatin (LIPITOR) 20 MG tablet Take 20 mg by mouth daily.    . BD PEN NEEDLE NANO 2ND GEN 32G X 4 MM MISC See admin instructions.    . cetirizine (ZYRTEC) 10 MG tablet Take 10 mg by mouth daily as needed for allergies.     . cloNIDine (CATAPRES) 0.1 MG tablet Take 0.1 mg by mouth at bedtime.     . Empagliflozin (JARDIANCE PO) Take 25 mg by mouth daily.     . hydrochlorothiazide (HYDRODIURIL) 25 MG tablet TAKE 1 TABLET BY MOUTH ONCE DAILY FOR 30 DAYS    . JARDIANCE 25 MG TABS tablet Take 25 mg by mouth every morning.    . Multiple Minerals-Vitamins (CALCIUM-MAGNESIUM-ZINC-D3 PO) Take by mouth.     . Multiple Vitamins-Minerals (ALIVE ONCE DAILY WOMENS PO) Take by mouth.    [name] OZEMPIC, 1 MG/DOSE, 2 MG/1.5ML SOPN INJECT 1MG  EVERY WEEK ON THE SAME DAY OF EACH WEEK IN THE ABDOMEN THIGHS OR UPPER ARM ROTATING INJECTION SITES    . predniSONE (DELTASONE) 20 MG tablet Take 1 tablet (20 mg total) by mouth 2 (two) times daily with a meal. 10 tablet 0  . QUEtiapine (SEROQUEL XR) 400 MG 24 hr tablet TAKE 1 TABLET BY MOUTH EVERY DAY 90 tablet 2  . telmisartan (MICARDIS) 80 MG tablet   6  . tiZANidine (ZANAFLEX) 4 MG tablet Take 1-2 tablets (4-8 mg total) by mouth every 6 (six) hours as needed for muscle spasms. 21 tablet 0  . traMADol (ULTRAM) 50 MG tablet Take  1 tablet (50 mg total) by mouth every 6 (six) hours as needed. 15 tablet 0  . TRESIBA FLEXTOUCH 200 UNIT/ML SOPN Inject 80 Units into the skin every evening.   5  . VASCEPA 1 g CAPS TAKE TWO CAPSULES BY MOUTH TWICE EVERY DAY WITH FOOD SWALLOW WHOLE     No current facility-administered medications for this visit.    Medication Side Effects: 30# weight gain with the increase in the Latuda.  No change so far with switch back to Seroquel XR.  Allergies:  Allergies  Allergen Reactions  . Decongestant Formula Other (See Comments)    TACHYCARDIA  . Grapefruit Flavor [Flavoring Agent] Other (See Comments)    DRUG INTERACTION WITH LATUDA  . Latex Rash  . Aspirin     UNSPECIFIED REASON Told not take by md years ago  . Nsaids     UNSPECIFIED REASON Told years ago by md not to take  . Adhesive [Tape] Rash  . Oxycodone Itching  . Tramadol Itching  . Vicodin [Hydrocodone-Acetaminophen] Itching    Past Medical History:  Diagnosis Date  . Anemia   . Anxiety   . Arthritis    knees, ankles, hips  . Asthma    daily and prn inhalers; triggered by environmental allergies  . Bipolar disorder ([place])   . Cancer [facility]) 2012   breast, left   . Diabetes mellitus    NIDDM  type2  . Elevated liver enzymes since age 62  . GERD (gastroesophageal  reflux disease)    OTC as needed  . Heart murmur    states no known problems  . Hypertension    under control; has been on med. > 12 yrs.  . Sleep apnea    cpap  summer  2018  . TIA (transient ischemic attack) age 16   TIA vs. Bell's palsy(was undetermined) - no residual deficits  . Wears glasses     History reviewed. No pertinent family history.  Social History   Socioeconomic History  . Marital status: Single    Spouse name: Not on file  . Number of children: Not on file  . Years of education: Not on file  . Highest education level: Not on file  Occupational History  . Not on file  Tobacco Use  . Smoking status: Never Smoker  . Smokeless tobacco: Never Used  Vaping Use  . Vaping Use: Never used  Substance and Sexual Activity  . Alcohol use: Yes    Comment: rarely  . Drug use: No  . Sexual activity: Not on file  Other Topics Concern  . Not on file  Social History Narrative  . Not on file   Social Determinants of Health   Financial Resource Strain:   . Difficulty of Paying Living Expenses: Not on file  Food Insecurity:   . Worried About Charity fundraiser in the Last Year: Not on file  . Ran Out of Food in the Last Year: Not on file  Transportation Needs:   . Lack of Transportation (Medical): Not on file  . Lack of Transportation (Non-Medical): Not on file  Physical Activity:   . Days of Exercise per Week: Not on file  . Minutes of Exercise per Session: Not on file  Stress:   . Feeling of Stress : Not on file  Social Connections:   . Frequency of Communication with Friends and Family: Not on file  . Frequency of Social Gatherings with Friends and Family: Not on file  .  Attends Religious Services: Not on file  . Active Member of Clubs or Organizations: Not on file  . Attends Archivist Meetings: Not on file  . Marital Status: Not on file  Intimate Partner Violence:   . Fear of Current or Ex-Partner: Not on file  . Emotionally Abused: Not on  file  . Physically Abused: Not on file  . Sexually Abused: Not on file    Past Medical History, Surgical history, Social history, and Family history were reviewed and updated as appropriate.   Please see review of systems for further details on the patient's review from today.   Objective:   Physical Exam:  There were no vitals taken for this visit.  Physical Exam Constitutional:      General: She is not in acute distress.    Appearance: She is well-developed. She is obese.  Musculoskeletal:        General: No deformity.  Neurological:     Mental Status: She is alert and oriented to person, place, and time.     Motor: No tremor.     Coordination: Coordination normal.     Gait: Gait normal.  Psychiatric:        Attention and Perception: She is attentive.        Mood and Affect: Mood is anxious. Mood is not depressed. Affect is not labile, blunt, angry or inappropriate.        Speech: Speech normal.        Behavior: Behavior normal.        Thought Content: Thought content normal. Thought content does not include homicidal or suicidal ideation. Thought content does not include homicidal or suicidal plan.        Cognition and Memory: Cognition normal.        Judgment: Judgment normal.     Comments: Insight intact. Judgment fair about self care. No auditory or visual hallucinations. No delusions.  No cycling.      Lab Review:     Component Value Date/Time   NA 134 (L) 10/18/2017 0506   K 4.2 10/18/2017 0506   CL 100 (L) 10/18/2017 0506   CO2 23 10/18/2017 0506   GLUCOSE 90 10/18/2017 0506   BUN 14 10/18/2017 0506   CREATININE 1.01 (H) 10/18/2017 0506   CALCIUM 8.6 (L) 10/18/2017 0506   PROT 7.1 10/15/2017 0902   ALBUMIN 3.6 10/15/2017 0902   AST 30 10/15/2017 0902   ALT 24 10/15/2017 0902   ALKPHOS 36 (L) 10/15/2017 0902   BILITOT 1.2 10/15/2017 0902   GFRNONAA >60 10/18/2017 0506   GFRAA >60 10/18/2017 0506       Component Value Date/Time   WBC 5.4 10/15/2017  0902   RBC 3.88 10/15/2017 0902   HGB 12.7 10/15/2017 0902   HCT 38.2 10/15/2017 0902   PLT 234 10/15/2017 0902   MCV 98.5 10/15/2017 0902   MCH 32.7 10/15/2017 0902   MCHC 33.2 10/15/2017 0902   RDW 13.4 10/15/2017 0902   LYMPHSABS 5.2 (H) 11/14/2010 1053   MONOABS 0.6 11/14/2010 1053   EOSABS 0.1 11/14/2010 1053   BASOSABS 0.0 11/14/2010 1053    No results found for: POCLITH, LITHIUM   No results found for: PHENYTOIN, PHENOBARB, VALPROATE, CBMZ   .res Assessment: Plan:    Bipolar I disorder, current or most recent episode depressed, in partial remission with rapid cycling ([place])  Generalized anxiety disorder  Insomnia due to mental condition  Nightmare  Caregiver stress  NM and insomnia resolved  since switch to Seroquel XR  Greater than 50% of 30 min  face to face time with patient was spent on counseling and coordination of care. We discussed History of bipolar disorder. Feels less depressed and less flat with switch from Latuda to Seroquel XR 400mg  daily. Also anxiety is better also. She had maxed out Latuda 120 mg daily and was recently having some anhedonic depression.  Wellbutrin added caused mood cycling.  She was also having some excessive spending.  Although symptoms have resolved with the switch to Seroquel X are so far.   Continue quetiapine XR 400 mg daily bc of history of  good response.    Discussed potential metabolic side effects associated with atypical antipsychotics, as well as potential risk for movement side effects. Advised pt to contact office if movement side effects occur.  Discussed risk of weight gain with Seroquel is greater than the risk with Latuda which could have an adverse effect on her diabetes.  She needs closely monitor that and try to guard against weight gain.  She has not gained weight on it. Disc HGBA1C is 4.8. better.  No med change today and she agrees except consider occ BZ due to marked stress.  We discussed the short-term risks  associated with benzodiazepines including sedation and increased fall risk among others.  Discussed long-term side effect risk including dependence, potential withdrawal symptoms, and the potential eventual dose-related risk of dementia.  But recent studies from 2020 dispute this association between benzodiazepines and dementia risk. Newer studies in 2020 do not support an association with dementia. Lorazepam 1 mg prn anxiety #30  FU 4-6 mos  Lynder Parents, MD, DFAPA   Please see After Visit Summary for patient specific instructions.  No future appointments.  No orders of the defined types were placed in this encounter.     -------------------------------

## 2020-07-26 ENCOUNTER — Encounter: Payer: Self-pay | Admitting: Orthopaedic Surgery

## 2020-07-26 ENCOUNTER — Ambulatory Visit: Payer: Self-pay

## 2020-07-26 ENCOUNTER — Ambulatory Visit (INDEPENDENT_AMBULATORY_CARE_PROVIDER_SITE_OTHER): Payer: Worker's Compensation | Admitting: Orthopaedic Surgery

## 2020-07-26 VITALS — BP 117/80 | HR 97 | Ht 62.0 in | Wt 177.0 lb

## 2020-07-26 DIAGNOSIS — M542 Cervicalgia: Secondary | ICD-10-CM | POA: Diagnosis not present

## 2020-07-26 DIAGNOSIS — M5126 Other intervertebral disc displacement, lumbar region: Secondary | ICD-10-CM | POA: Diagnosis not present

## 2020-07-26 NOTE — Progress Notes (Signed)
Office Visit Note   Patient: Rebecca Rice           Date of Birth: [birth year]           MRN: [id number] Visit Date: 07/26/2020              Requested by: Jilda Panda, MD [address] [place],  [place] [address] PCP: Jilda Panda, MD   Assessment & Plan: Visit Diagnoses:  1. Neck pain   2. Protrusion of lumbar intervertebral disc     Plan: With her associated neck and shoulder pain we obtain 2 view x-ray cervical spine which shows no acute changes.  Currently she is having take care of her father and is not able to work at any position due to family obligations.  MRI results before and after her surgery reviewed.  She is a good candidate for single epidural injection at L5-S1.  I did not give her a work note since she is not able to work due to take care of her Father who has health problems.  Patient can call about proceeding with an L5-S1 epidural injection if she so desires.  Recheck 1 month.  Follow-Up Instructions: Return in about 1 month (around 08/26/2020).   Orders:  Orders Placed This Encounter  Procedures  . XR Cervical Spine 2 or 3 views   No orders of the defined types were placed in this encounter.     Procedures: No procedures performed   Clinical Data: No additional findings.   Subjective: Chief Complaint  Patient presents with  . Lower Back - Pain    OTJI 10/03/2019    HPI 49 year old female here for Worker's Comp. injury from 10/03/2019.  I previously took care of her in 2019 for left L5-S1 microdiscectomy which did well.  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  Back pain became intense and then increased pain in both shoulders.  Patient had an MRI of her shoulder.  She is also had back pain in the same area where she had previously treated in the lumbar spine.  Pain radiates at the midline out to the side over the sacroiliac joints states she feels  like fist is pushing in the sacroiliac joint radiating into her buttocks.  Since surgery she is always had some numbness in her toes but no good relief of pain in her leg and weakness in her leg.  Patient was injured 10/03/2019 and has not worked since 01/07/2020 and states she is having to take care of her dad who has medical problems after the death of her mother.  Patient has a case manager Sydnee Levans, RN, CCM fax number is [phone].  Patient had an MRI ordered by Dr. 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  Cervical disc herniation L5-S1 on the left 2019.  Negative for bowel bladder associated symptoms no chills or fever.   Objective: Vital Signs: BP 117/80   Pulse 97   Ht 5\' 2"  (1.575 m)   Wt 177 lb (80.3 kg)   BMI 32.37 kg/m   Physical Exam Constitutional:      Appearance: She is well-developed.  HENT:     Head: Normocephalic.     Right Ear: External ear normal.  Left Ear: External ear normal.  Eyes:     Pupils: Pupils are equal, round, and reactive to light.  Neck:     Thyroid: No thyromegaly.     Trachea: No tracheal deviation.  Cardiovascular:     Rate and Rhythm: Normal rate.  Pulmonary:     Effort: Pulmonary effort is normal.  Abdominal:     Palpations: Abdomen is soft.  Skin:    General: Skin is warm and dry.  Neurological:     Mental Status: She is alert and oriented to person, place, and time.  Psychiatric:        Mood and Affect: Mood and affect normal.        Behavior: Behavior normal.     Ortho Exam well-healed lumbar incision at L5-S1.  Patient has some tenderness over the trapezial muscle mild discomfort with impingement right and left negative drop arm test.  Brachial plexus is tender both right and left.  Upper extremity and lower extremity reflexes are 2+ and  symmetrical.  Specialty Comments:  No specialty comments available.  Imaging: AP lateral cervical spine x-rays are obtained and reviewed.  This shows some loss of cervical lordosis with slight narrowing and spurring at C5-6.  No listhesis.  No acute changes noted.  Impression: Nonspecific reversal of normal curvature slight narrowing C5-6 disc space.   PMFS History: Patient Active Problem List   Diagnosis Date Noted  . Protrusion of lumbar intervertebral disc 07/30/2020  . Neck pain 01/28/2018  . HNP (herniated nucleus pulposus), lumbar 10/17/2017  . Acquired absence of breast and nipple 10/17/2011  . 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 Anemia   . Anxiety   . Arthritis    knees, ankles, hips  . Asthma    daily and prn inhalers; triggered by environmental allergies  . Bipolar disorder ([place])   . Cancer [facility]) 2012   breast, left   . Diabetes mellitus    NIDDM  type2  . Elevated liver enzymes since age 68  . GERD (gastroesophageal reflux disease)    OTC as needed  . Heart murmur    states no known problems  . Hypertension    under control; has been on med. > 12 yrs.  . Sleep apnea    cpap  summer  2018  . TIA (transient ischemic attack) age 50   TIA vs. Bell's palsy(was undetermined) - no residual deficits  . Wears glasses     No family history on file.  Past Surgical History:  Procedure Laterality Date  . BACK SURGERY  10/17/2017  . BREAST CAPSULECTOMY WITH IMPLANT EXCHANGE  06/24/2012   Procedure: BREAST CAPSULECTOMY WITH IMPLANT EXCHANGE;  Surgeon: Theodoro Kos, DO;  Location: [place];  Service: Plastics;  Laterality: Bilateral;  removal of bilateral breast expanders capsulectomies and placement of implants    . BREAST IMPLANT EXCHANGE Bilateral 07/05/2013   Procedure: REMOVAL OF BILATERAL IMPLANTS REPLACEMENT OF BILATERAL IMPLANTS BILATERAL  ;  Surgeon: Cristine Polio, MD;  Location: [place];  Service: Plastics;  Laterality: Bilateral;  . BREAST RECONSTRUCTION  10/17/2011   Procedure: BREAST RECONSTRUCTION;  Surgeon: Theodoro Kos, DO;  Location: [place];  Service: Plastics;  Laterality: Bilateral;  bilateral breast reconstruction with expander and flex hd placement  . BREAST RECONSTRUCTION N/A 04/18/2014   Procedure: LOWER OF RIGHT INFRA MAMMARY FOLD/RIGHT AND LEFT NIPPLE AREOLA COMPLEX RECONSTRUCTION Additional Normal saline added to  right breast implant;  Surgeon: Cristine Polio, MD;  Location: [place];  Service: Plastics;  Laterality: N/A;  . FOOT SURGERY Bilateral 01/2010; 1998  . LAPAROSCOPIC CHOLECYSTECTOMY SINGLE SITE WITH INTRAOPERATIVE CHOLANGIOGRAM N/A 12/29/2015   Procedure: LAPAROSCOPIC CHOLECYSTECTOMY SINGLE SITE WITH INTRA OP CHOLANGIOGRAM ;  Surgeon: Michael Boston, MD;  Location: [facility];  Service: General;  Laterality: N/A;  . LASIK Bilateral 2002  . LIVER BIOPSY N/A 12/29/2015   Procedure: NEEDLE CORE LIVER BIOPSY;  Surgeon: Michael Boston, MD;  Location: [facility];  Service: General;  Laterality: N/A;  . LUMBAR LAMINECTOMY/DECOMPRESSION MICRODISCECTOMY N/A 10/17/2017   Procedure: LEFT L5-S1 MICRODISCECTOMY;  Surgeon: Marybelle Killings, MD;  Location: [place];  Service: Orthopedics;  Laterality: N/A;  . MASTECTOMY  11/21/2010   bilat. simple mastect.lt snbx  . MASTOPEXY Bilateral 02/21/2014   Procedure: SCAR REVISION RIGHT AND LEFT BREAST;  Surgeon: Cristine Polio, MD;  Location: [place];  Service: Plastics;  Laterality: Bilateral;  . SCAR REVISION Bilateral 07/05/2013   Procedure: SCAR REVISION BILATERAL EXCISION OF SEVERE EXCESSIVE BREAST TISSUE ;  Surgeon: Cristine Polio, MD;  Location: [place];  Service: Plastics;  Laterality: Bilateral;  . WISDOM TOOTH EXTRACTION  age 8   Social History   Occupational History  . Not on file  Tobacco Use  . Smoking status: Never Smoker  . Smokeless  tobacco: Never Used  Vaping Use  . Vaping Use: Never used  Substance and Sexual Activity  . Alcohol use: Yes    Comment: rarely  . Drug use: No  . Sexual activity: Not on file

## 2020-07-30 DIAGNOSIS — M5126 Other intervertebral disc displacement, lumbar region: Secondary | ICD-10-CM | POA: Insufficient documentation

## 2020-07-30 NOTE — Addendum Note (Signed)
Addended by: Marybelle Killings on: 07/30/2020 06:55 PM   Modules accepted: Level of Service

## 2020-08-15 ENCOUNTER — Telehealth: Payer: Self-pay | Admitting: Radiology

## 2020-08-15 DIAGNOSIS — M5126 Other intervertebral disc displacement, lumbar region: Secondary | ICD-10-CM

## 2020-08-15 NOTE — Telephone Encounter (Signed)
Marybelle Killings, MD  Carmine Savoy, RT Ok thanks        Previous Messages   ----- Message -----  From: Carmine Savoy, RT  Sent: 08/15/2020  8:42 AM EST  To: Marybelle Killings, MD  Subject: FW: SECURE: ESI Authorized-----    Step*        Please see below. W/C is going to approve ESI that you advised and would like to transfer care to you. OK for this?  ----- Message -----  From: Deboraha Sprang  Sent: 08/15/2020  8:13 AM EST  To: Carmine Savoy, RT  Subject: SECURE: ESI Authorized-----    Steps toGwinda Passe   See response regarding the ESI with transfer of care to Dr. Lorin Mercy, MD. The [facility] is authorize for Ms. Brown with the transfer of her care to Dr. Lorin Mercy, MD per e-mail below. Do we setup the ESI to be completed? Thx Tisha   Melissa [email or web address]>  Tue 08/15/2020 6:23 AM  *Caution - External email - see footer for warnings*   Good morning, Tisha! Please see below and advise of the next steps to proceed with transfer of care with Dr. Lorin Mercy as well as for the [facility] he has recommended. Thank you!   ---------- Forwarded message ---------  From: Margarette Canada [email or web address]>  Date: Mon, Aug 14, 2020 at 3:38 PM  Subject: Sindhu, Nguyen [id number] AUTH  To: Estevan Ryder [email or web address]>  Cc: [email or web address]    Thank you! I will make the client and MD office aware.   On Mon, Aug 14, 2020 at 2:55 PM Estevan Ryder [email or web address]> wrote:  I  will authorize the [facility] with transfer of care to Dr. Lorin Mercy. thanks    Above messages in regards to transfer of care and ESI order. Per Dr. Lorin Mercy, ok to enter order and for transfer of care.

## 2020-09-01 ENCOUNTER — Other Ambulatory Visit: Payer: Self-pay | Admitting: Radiology

## 2020-09-01 ENCOUNTER — Telehealth: Payer: Self-pay | Admitting: Radiology

## 2020-09-01 DIAGNOSIS — M5126 Other intervertebral disc displacement, lumbar region: Secondary | ICD-10-CM

## 2020-09-01 NOTE — Telephone Encounter (Signed)
Dr. Lorin Mercy ordered Lumbar ESI for patient. It was approved by [employer]. Dr. Romona Curls scheduler reached out to patient to schedule, however, patient could not attend any appointment that was offered to her. Patient had concern for needing driver post ESI. Tisha spoke with Margarette Canada, case manager, who states that they can accommodate patient with driver. There was question as to whether ESI could be done at [facility], however, that is not a procedure offered there. New referral has been entered for [facility] to see if their schedule will better accommodate patient.    Wendy-could you please make sure all information is there for [facility] to schedule? Thank you so much for your help.

## 2020-09-01 NOTE — Telephone Encounter (Signed)
I changed the order to [facility].  They should be able to see it.

## 2020-09-01 NOTE — Telephone Encounter (Signed)
noted 

## 2020-09-13 ENCOUNTER — Other Ambulatory Visit: Payer: Self-pay | Admitting: Orthopaedic Surgery

## 2020-09-13 ENCOUNTER — Ambulatory Visit
Admission: RE | Admit: 2020-09-13 | Discharge: 2020-09-13 | Disposition: A | Discharge status: Home / Self Care | Payer: Self-pay | Source: Ambulatory Visit | Attending: Orthopaedic Surgery | Admitting: Orthopaedic Surgery

## 2020-09-13 ENCOUNTER — Other Ambulatory Visit: Payer: Self-pay

## 2020-09-13 DIAGNOSIS — M5126 Other intervertebral disc displacement, lumbar region: Secondary | ICD-10-CM

## 2020-09-13 MED ORDER — IOPAMIDOL (ISOVUE-M 200) INJECTION 41%
1.0000 mL | Freq: Once | INTRAMUSCULAR | Status: AC
  Administered 2020-09-13: 1 mL via EPIDURAL

## 2020-09-13 MED ORDER — METHYLPREDNISOLONE ACETATE 40 MG/ML INJ SUSP (RADIOLOG
120.0000 mg | Freq: Once | INTRAMUSCULAR | Status: AC
  Administered 2020-09-13: 120 mg via EPIDURAL

## 2020-09-13 NOTE — Discharge Instructions (Signed)

## 2020-09-15 ENCOUNTER — Telehealth: Payer: Self-pay

## 2020-09-15 NOTE — Telephone Encounter (Signed)
The nurse case manager called. Pt has her ESI and she is in a lot of pain. She would like to know when the pt needs to follow back up with Catalina [place]ua - [phone]

## 2020-09-15 NOTE — Telephone Encounter (Signed)
A few weeks after [facility]

## 2020-10-03 ENCOUNTER — Ambulatory Visit: Payer: Self-pay | Admitting: Orthopaedic Surgery

## 2020-10-10 ENCOUNTER — Encounter: Payer: Self-pay | Admitting: Orthopaedic Surgery

## 2020-10-10 ENCOUNTER — Ambulatory Visit (INDEPENDENT_AMBULATORY_CARE_PROVIDER_SITE_OTHER): Payer: No Typology Code available for payment source | Admitting: Orthopaedic Surgery

## 2020-10-10 VITALS — BP 114/76 | HR 85 | Ht 62.0 in | Wt 177.0 lb

## 2020-10-10 DIAGNOSIS — M5126 Other intervertebral disc displacement, lumbar region: Secondary | ICD-10-CM

## 2020-10-10 NOTE — Progress Notes (Signed)
Office Visit Note   Patient: Rebecca Rice           Date of Birth: [birth year]           MRN: [id number] Visit Date: 10/10/2020              Requested by: Jilda Panda, MD [address],  [place] [address] PCP: Jilda Panda, MD   Assessment & Plan: Visit Diagnoses:  1. Protrusion of lumbar intervertebral disc     Plan: We do not have the CD from her lumbar MRI that occurred after on-the-job injury 10/03/2019.  She will obtain a copy of the disc or case manager can arrange a new disc to be burned so she can bring it with her in 2 weeks and we can review.  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  Plan review lumbar MRI disc with patient and case manager on return.  Follow-Up Instructions: Return in about 2 weeks (around 10/24/2020).   Orders:  No orders of the defined types were placed in this encounter.  No orders of the defined types were placed in this encounter.     Procedures: No procedures performed   Clinical Data: No additional findings.   Subjective: Chief Complaint  Patient presents with   Lower Back - Follow-up    States that her back is no different, states that it is the same after the injection as it was before it. Taking tylenol TID    HPI 50 year old female returns for follow-up after on-the-job injury 10/03/2019.  Patient's had recent epidural 09/13/2020 and is here with her father who she is now providing full-time care for and also Creed Copper, RN, CCM case manager fax [phone] is present.  Patient states epidural gave her no relief she described how painful it was.  She is not noticed any difference over the last several weeks since injection and asked why which she sent for an injection since it did not help her at all.  She is also had problems with her shoulder but this is not included in her Worker's Comp. coverage per carrier.  MRI of her  shoulder ordered by Dr. 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  Patient had previous left L5-S1 microdiscectomy 10/17/2017.  Patient's MRI after her on-the-job injury 10/03/2019 showed some scar tissue around the left L5-S1 microdiscectomy site without disc recurrence and some foraminal narrowing without compression.  Review of Systems updated.  Of note is past history of breast cancer.  No associated bowel or bladder symptoms.  All other systems are noncontributory to HPI.   Objective: Vital Signs: BP 114/76 (BP Location: Left Arm, Patient Position: Sitting)    Pulse 85    Ht 5\' 2"  (1.575 m)    Wt 177 lb (80.3 kg)    BMI 32.37 kg/m   Physical Exam Constitutional:      Appearance: She is well-developed.  HENT:     Head: Normocephalic.     Right Ear: External ear normal.     Left Ear: External ear normal.  Eyes:     Pupils: Pupils are equal, round, and reactive to light.  Neck:     Thyroid: No thyromegaly.     Trachea: No tracheal deviation.  Cardiovascular:     Rate and Rhythm: Normal rate.  Pulmonary:     Effort: Pulmonary effort is normal.  Abdominal:     Palpations: Abdomen is soft.  Skin:    General: Skin is warm and dry.  Neurological:     Mental Status: She is alert and oriented to person, place, and time.  Psychiatric:        Mood and Affect: Mood and affect normal.        Behavior: Behavior normal.     Ortho Exam patient has negative logroll that hips she is able to heel and toe walk.  No lower extremity atrophy no rash over exposed skin.  Specialty Comments:  No specialty comments available.  Imaging: No results found.   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 > 12 yrs.   Sleep apnea    cpap  summer  2018   TIA (transient ischemic attack) age 34   TIA vs. Bell's palsy(was undetermined) - no residual deficits   Wears glasses     No family history on file.  Past Surgical History:  Procedure Laterality Date   BACK SURGERY  10/17/2017   BREAST CAPSULECTOMY WITH IMPLANT EXCHANGE  06/24/2012   Procedure: BREAST CAPSULECTOMY WITH IMPLANT EXCHANGE;  Surgeon: Theodoro Kos, DO;  Location: [place];  Service: Plastics;  Laterality: Bilateral;  removal of bilateral breast expanders capsulectomies and placement of implants     BREAST IMPLANT EXCHANGE Bilateral 07/05/2013   Procedure: REMOVAL OF BILATERAL IMPLANTS REPLACEMENT OF BILATERAL IMPLANTS BILATERAL  ;  Surgeon: Cristine Polio, MD;  Location: [place];  Service: Plastics;  Laterality: Bilateral;   BREAST RECONSTRUCTION  10/17/2011   Procedure: BREAST RECONSTRUCTION;  Surgeon: Theodoro Kos, DO;  Location: [place];  Service: Plastics;  Laterality: Bilateral;  bilateral breast reconstruction with expander and flex hd placement   BREAST RECONSTRUCTION N/A 04/18/2014   Procedure: LOWER OF RIGHT INFRA MAMMARY FOLD/RIGHT AND LEFT NIPPLE AREOLA COMPLEX RECONSTRUCTION Additional Normal saline added to right breast implant;  Surgeon: Cristine Polio, MD;  Location: [place];  Service: Plastics;   Laterality: N/A;   FOOT SURGERY Bilateral 01/2010; [place] WITH INTRAOPERATIVE CHOLANGIOGRAM N/A 12/29/2015   Procedure: LAPAROSCOPIC CHOLECYSTECTOMY SINGLE SITE WITH INTRA OP CHOLANGIOGRAM ;  Surgeon: Michael Boston, MD;  [facility];  Service: General;  Laterality: N/A;   LASIK Bilateral 2002   LIVER BIOPSY N/A 12/29/2015   Procedure: NEEDLE CORE LIVER BIOPSY;  Surgeon: Michael Boston, MD;  Location: [facility];  Service: General;  Laterality: N/A;   LUMBAR LAMINECTOMY/DECOMPRESSION MICRODISCECTOMY N/A 10/17/2017   Procedure: LEFT L5-S1 MICRODISCECTOMY;  Surgeon: Marybelle Killings, MD;  Location: [place];  Service: Orthopedics;  Laterality: N/A;   MASTECTOMY  11/21/2010   bilat. simple mastect.lt snbx   MASTOPEXY Bilateral 02/21/2014   Procedure: SCAR REVISION RIGHT AND LEFT BREAST;  Surgeon: Cristine Polio, MD;  Location: [place];  Service: Plastics;  Laterality: Bilateral;   SCAR REVISION Bilateral 07/05/2013   Procedure: SCAR REVISION BILATERAL EXCISION OF SEVERE EXCESSIVE BREAST TISSUE ;  Surgeon: Cristine Polio, MD;  Location: [place];  Service: Plastics;  Laterality: Bilateral;   WISDOM TOOTH EXTRACTION  age 44   Social History   Occupational History   Not on file  Tobacco Use   Smoking status: Never Smoker   Smokeless tobacco: Never Used  Vaping Use   Vaping Use: Never used  Substance and Sexual Activity   Alcohol use: Yes    Comment: rarely   Drug use: No   Sexual activity: Not on file

## 2020-10-25 ENCOUNTER — Ambulatory Visit: Payer: Medicaid Other | Admitting: Orthopaedic Surgery

## 2020-10-27 ENCOUNTER — Ambulatory Visit: Payer: Medicaid Other | Admitting: Orthopaedic Surgery

## 2020-11-10 ENCOUNTER — Ambulatory Visit (INDEPENDENT_AMBULATORY_CARE_PROVIDER_SITE_OTHER): Payer: No Typology Code available for payment source | Admitting: Orthopaedic Surgery

## 2020-11-10 ENCOUNTER — Other Ambulatory Visit: Payer: Self-pay

## 2020-11-10 ENCOUNTER — Encounter: Payer: Self-pay | Admitting: Orthopaedic Surgery

## 2020-11-10 DIAGNOSIS — Z9889 Other specified postprocedural states: Secondary | ICD-10-CM

## 2020-11-10 NOTE — Progress Notes (Signed)
Office Visit Note   Patient: Rebecca Rice           Date of Birth: 1971/04/14           MRN: [id number] Visit Date: 11/10/2020              Requested by: Jilda Panda, MD [address],  [place] [address] PCP: Jilda Panda, MD   Assessment & Plan: Visit Diagnoses:  1. Status post lumbar microdiscectomy     Plan: We discussed patient's there are some treatment options but currently she is having take care of her father full-time.  She does have chronic changes with this osteophyte complex and some subarticular narrowing with mild impingement.  She can return in 90months.  She can call my complaining about her shoulder if she so desires.  Follow-Up Instructions: Return in about 6 months (around 05/12/2021).   Orders:  No orders of the defined types were placed in this encounter.  No orders of the defined types were placed in this encounter.     Procedures: No procedures performed   Clinical Data: No additional findings.   Subjective: Chief Complaint  Patient presents with  . Lower Back - Pain    MRI review [facility] 10/03/2019    HPI 50 year old female returns with case manager Creed Copper, RN, CCM fax number [phone].  She is seen for follow-up after an on-the-job injury 10/03/2019.  She is home taking care of her father is having progressive dementia problems and she is looking at placement options but currently is the sole caregiver.  MRI disc is available from [facility] for lumbar spine MRI.  She also had a shoulder MRI not Worker's Comp. related but this was not reviewed today and she may make an appointment later if she so desires.  [facility] MRI scan was read as postsurgical changes at L5-S1 with moderate subarticular zone narrowing and mild impingement on descending left S1 nerve root with moderate to severe bulging disc osteophyte complex asymmetric to the left mild to moderate left neural foraminal narrowing.  Mild lumbar scoliosis.  There was  mild lateral bulge on the right at L4-5 and also mild right lateral bulge at L3-4.  MRI scan date was 04/07/2020.  Patient had a single epidural which she states was the most painful thing she had ever had besides the breast biopsy and she rates the pain at 10 out of 10.  She did not get relief with the epidural.  Patient continues to take care of her father which involves some pulling, bending, lifting etc.  Review of Systems previous lumbar L5-S1 microdiscectomy by me 10/17/2017 prior to her Worker's Comp. injury.  All the systems noncontributory to HPI.   Objective: Vital Signs: BP 116/72   Pulse 94   Ht 5\' 2"  (1.575 m)   Wt 177 lb (80.3 kg)   BMI 32.37 kg/m   Physical Exam Constitutional:      Appearance: She is well-developed.  HENT:     Head: Normocephalic.     Right Ear: External ear normal.     Left Ear: External ear normal.  Eyes:     Pupils: Pupils are equal, round, and reactive to light.  Neck:     Thyroid: No thyromegaly.     Trachea: No tracheal deviation.  Cardiovascular:     Rate and Rhythm: Normal rate.  Pulmonary:     Effort: Pulmonary effort is normal.  Abdominal:     Palpations: Abdomen is soft.  Skin:    General: Skin is warm and dry.  Neurological:     Mental Status: She is alert and oriented to person, place, and time.  Psychiatric:        Behavior: Behavior normal.     Ortho Exam patient is able to heel and toe walk.  Lumbar incisions well-healed.  Anterior tib gastrocsoleus is strong and symmetrical.  Some pain with sciatic notch palpation on the left side.  Specialty Comments:  No specialty comments available.  Imaging: No results found.   PMFS History: Patient Active Problem List   Diagnosis Date Noted  . Status post lumbar microdiscectomy 11/13/2020  . Protrusion of lumbar intervertebral disc 07/30/2020  . Neck pain 01/28/2018  . Acquired absence of breast and nipple 10/17/2011  . 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 Anemia   . Anxiety   . Arthritis    knees, ankles, hips  . Asthma    daily and prn inhalers; triggered by environmental allergies  . Bipolar disorder ([place])   . Cancer [facility]) 2012   breast, left   . Diabetes mellitus    NIDDM  type2  . Elevated liver enzymes since age 60  . GERD (gastroesophageal reflux disease)    OTC as needed  . Heart murmur    states no known problems  . Hypertension    under control; has been on med. > 12 yrs.  . Sleep apnea    cpap  summer  2018  . TIA (transient ischemic attack) age 50   TIA vs. Bell's palsy(was undetermined) - no residual deficits  . Wears glasses     No family history on file.  Past Surgical History:  Procedure Laterality Date  . BACK SURGERY  10/17/2017  . BREAST CAPSULECTOMY WITH IMPLANT EXCHANGE  06/24/2012   Procedure: BREAST CAPSULECTOMY WITH IMPLANT EXCHANGE;  Surgeon: Theodoro Kos, DO;  Location: [place];  Service: Plastics;  Laterality: Bilateral;  removal of bilateral breast expanders capsulectomies and placement of implants    . BREAST IMPLANT EXCHANGE Bilateral 07/05/2013   Procedure: REMOVAL OF BILATERAL IMPLANTS REPLACEMENT OF BILATERAL IMPLANTS BILATERAL  ;  Surgeon: Cristine Polio, MD;  Location: [place];  Service: Plastics;  Laterality: Bilateral;  . BREAST RECONSTRUCTION  10/17/2011   Procedure: BREAST RECONSTRUCTION;  Surgeon: Theodoro Kos, DO;  Location: [place];  Service: Plastics;  Laterality: Bilateral;  bilateral breast reconstruction with expander and flex hd placement  . BREAST RECONSTRUCTION N/A 04/18/2014   Procedure: LOWER OF RIGHT INFRA MAMMARY FOLD/RIGHT AND LEFT NIPPLE AREOLA COMPLEX RECONSTRUCTION Additional Normal saline added to right breast implant;  Surgeon: Cristine Polio, MD;  Location: [place];  Service: Plastics;  Laterality: N/A;  . FOOT SURGERY Bilateral 01/2010; 1998  . LAPAROSCOPIC  CHOLECYSTECTOMY SINGLE SITE WITH INTRAOPERATIVE CHOLANGIOGRAM N/A 12/29/2015   Procedure: LAPAROSCOPIC CHOLECYSTECTOMY SINGLE SITE WITH INTRA OP CHOLANGIOGRAM ;  Surgeon: Michael Boston, MD;  [facility];  Service: General;  Laterality: N/A;  . LASIK Bilateral 2002  . LIVER BIOPSY N/A 12/29/2015   Procedure: NEEDLE CORE LIVER BIOPSY;  Surgeon: Michael Boston, MD;  [facility];  Service: General;  Laterality: N/A;  . LUMBAR LAMINECTOMY/DECOMPRESSION MICRODISCECTOMY N/A 10/17/2017   Procedure: LEFT L5-S1 MICRODISCECTOMY;  Surgeon: Marybelle Killings, MD;  Location: [place];  Service: Orthopedics;  Laterality: N/A;  . MASTECTOMY  11/21/2010   bilat. simple mastect.lt snbx  .  MASTOPEXY Bilateral 02/21/2014   Procedure: SCAR REVISION RIGHT AND LEFT BREAST;  Surgeon: Cristine Polio, MD;  Location: [place];  Service: Plastics;  Laterality: Bilateral;  . SCAR REVISION Bilateral 07/05/2013   Procedure: SCAR REVISION BILATERAL EXCISION OF SEVERE EXCESSIVE BREAST TISSUE ;  Surgeon: Cristine Polio, MD;  Location: [place];  Service: Plastics;  Laterality: Bilateral;  . WISDOM TOOTH EXTRACTION  age 68   Social History   Occupational History  . Not on file  Tobacco Use  . Smoking status: Never Smoker  . Smokeless tobacco: Never Used  Vaping Use  . Vaping Use: Never used  Substance and Sexual Activity  . Alcohol use: Yes    Comment: rarely  . Drug use: No  . Sexual activity: Not on file

## 2020-11-13 DIAGNOSIS — Z9889 Other specified postprocedural states: Secondary | ICD-10-CM | POA: Insufficient documentation

## 2020-11-14 ENCOUNTER — Ambulatory Visit (INDEPENDENT_AMBULATORY_CARE_PROVIDER_SITE_OTHER): Payer: 59 | Admitting: Psychiatry

## 2020-11-14 ENCOUNTER — Other Ambulatory Visit: Payer: Self-pay

## 2020-11-14 ENCOUNTER — Encounter: Payer: Self-pay | Admitting: Psychiatry

## 2020-11-14 DIAGNOSIS — F515 Nightmare disorder: Secondary | ICD-10-CM

## 2020-11-14 DIAGNOSIS — F3175 Bipolar disorder, in partial remission, most recent episode depressed: Secondary | ICD-10-CM

## 2020-11-14 DIAGNOSIS — Z636 Dependent relative needing care at home: Secondary | ICD-10-CM

## 2020-11-14 DIAGNOSIS — F5105 Insomnia due to other mental disorder: Secondary | ICD-10-CM | POA: Diagnosis not present

## 2020-11-14 DIAGNOSIS — F411 Generalized anxiety disorder: Secondary | ICD-10-CM

## 2020-11-14 MED ORDER — QUETIAPINE FUMARATE ER 300 MG PO TB24
600.0000 mg | ORAL_TABLET | Freq: Every day | ORAL | 1 refills | Status: DC
Start: 2020-11-14 — End: 2020-12-28

## 2020-11-14 NOTE — Progress Notes (Signed)
Rebecca Rice [id number] Apr 12, 1971 50 y.o.  Subjective:   Patient ID:  Rebecca Rice is a 50 y.o. (DOB November 23, 1970) female.  Chief Complaint:  Chief Complaint  Patient presents with  . Follow-up  . Bipolar I disorder, current or most recent episode depresse  . Depression  . Stress    Depression        Associated symptoms include no decreased concentration, no fatigue and no suicidal ideas.   Rebecca Rice presents to the office today for follow-up of bipolar depression.   When seen March 26, 2019.  The patient was complaining of flatness with some depression.  There was concern that the high dose Latuda may be flattening her somewhat and so it was reduced from 120 mg to 80 mg.  We discussed the risk of developing mania.  seen October 2020.  For anhedonic depression we made the following changes: Increase Latuda to 120 mg daily. Trial Wellbutrin XL 150 mg tablets 1 each AM The following was noted: Increased spending continues. Mood swings worse.   More energy at times and roller coaster with mood, energy and activity.  Read a book which is progress.  Overactivity at times like yesterday but Sat couldn't get out of bed.  Cycles daily 1 day real up and next day worn out. Cycles with sleep as well. If up late surfing on internet to buy or reorganizing closet or cleaning.  Trouble finishing things.   Spending time with parents.  F has dementia. betterl anhedonia.  seen December 2020.  As noted she was mood cycling with some manic symptoms.  Because it was not being controlled by the Latuda the decision was made to switch back to Seroquel which had been helpful in the past.  The Latuda was weaned, trazodone and Wellbutrin were stopped, and Seroquel XR was added and increased to 400 mg every afternoon. Pleased she has less spending.  Sometimes still a little too exuberant but not bad.  Others have noted her being "very  Happy".  Usually 7 -8 hours but work schedule dependent.  No depression  problems since here.  Surprising.  No SE so far.  Last seen January 2020.  She was doing relatively well and no meds were changed.  As of March 30.  Emotionally OK despite stress.  Seroquel is doing it's job so no complaints. Not signifcantly depressed.  Sleep OK usually except when stressed.  Big stressor dealing with parents who both have Alzheimer's.  Needs to be guardian and have them placed.  Patient denies any recent difficulty with anxiety.  No further bad dreams.  Denies appetite disturbance.  Patient reports that energy and motivation have been reduced.  Patient denies any difficulty with concentration.  Patient denies any suicidal ideation. No menses in 2 years but did this month. 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 Changed her mind and thinking of respiratory therapy school but it would take 2 years.  She's helping care for her parents.  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 and going to school.  She feels she needs to change career to make more money.  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 Good grades in school. Plan: no med changes  05/16/2020 appointment with the following noted: 5/14 Mo had SZ and father has dementia and SZ lasted 2 hours. MI and then passed away.  She's only child so had to quit work and care for father.  She's only child.  No one to care  for father.  Has open worker's comp case for back injury.  Can't get help with father bc of Dentist.  Has to do repairs on the house.  Looking on work from home job. More stressed than  Depressed.  Doesn't have the money to fix her house and couldn't sell it without the repairs. Has to try to get guardianship for father and doesn't have the money. Can't say Seroquel not working.  Father's sleep is erratic therefore her sleep is erratic.  He has a day care which is hard to get him to.   He thinks pt is his wife. A lot of anger that parents didn't prepare for this when she asked them  to do it. Trying Aswaganda to help with anxiety and seems to help. Lost from 221# to 182# over a year. Plan: Continue Seroquel XR 400 daily with no med changes.  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   May need to find another doctor.  Not sure how long she'll  Be on Medicaid. Taking care of father and may need placement.  New partner Gerald Stabs notices she's neglecting things and not reading as much with reduced interest.   Recent SI in Feb with depression and stressed out.  Not that bad now but depression still there.   Cannot move father bc of reinjured back and shoulder.  She had promised she wouldn't put him in home but now has been told it's necessary per her doctor's. Spent all her money saved on the house and situation.  House has no HVAC and needs roof.  Cars need to be sold and house and will leave her homeless. Gerald Stabs has been helpful. May be sleeping more than normal but has to get up at night with father.   Has sleep apnea. No history of addiction to drugs.  Past Psychiatric M edication Trials:  lithium, VPA, carbamazepine Seroquel worked but SE,  risperidone, Latuda 120 Wellbutrin mood cycling Trazodone  Review of Systems:  Review of Systems  Constitutional: Negative for fatigue.  Cardiovascular: Negative for chest pain and palpitations.  Endocrine: Negative for polyuria.  Neurological: Negative for tremors, weakness and light-headedness.  Psychiatric/Behavioral: Positive for depression. Negative for agitation, behavioral problems, confusion, decreased concentration, dysphoric mood, hallucinations, self-injury, sleep disturbance and suicidal ideas. The patient is not nervous/anxious and is not hyperactive.     Medications: I have reviewed the patient's current medications.  Current Outpatient Medications  Medication Sig Dispense Refill  . albuterol (PROVENTIL HFA;VENTOLIN HFA) 108  (90 BASE) MCG/ACT inhaler Inhale 2 puffs into the lungs every 6 (six) hours as needed for wheezing or shortness of breath.     . APPLE CIDER VINEGAR PO Take by mouth.    . ASHWAGANDHA PO Take by mouth.    Darlin Priestly HFA 100 MCG/ACT AERO SMARTSIG:2 Puff(s) Via Inhaler Morning-Night    . atorvastatin (LIPITOR) 10 MG tablet Take 10 mg by mouth daily.    . cetirizine (ZYRTEC) 10 MG tablet Take 10 mg by mouth daily as needed for allergies.     . cloNIDine (CATAPRES) 0.1 MG tablet Take 0.1 mg by mouth at bedtime.     . hydrochlorothiazide (HYDRODIURIL) 25 MG tablet TAKE 1 TABLET BY MOUTH ONCE DAILY FOR 30 DAYS    . JARDIANCE 25 MG TABS tablet Take 25 mg by mouth every morning.    [name] LORazepam (ATIVAN) 1 MG tablet Take 1 tablet (1 mg total)  by mouth every 8 (eight) hours. 30 tablet 0  . Moringa Oleifera (MORINGA PO) Take by mouth.    . Multiple Minerals-Vitamins (CALCIUM-MAGNESIUM-ZINC-D3 PO) Take by mouth.    . Multiple Vitamins-Minerals (ALIVE ONCE DAILY WOMENS PO) Take by mouth.    [name] OZEMPIC, 1 MG/DOSE, 2 MG/1.5ML SOPN INJECT 1MG  EVERY WEEK ON THE SAME DAY OF EACH WEEK IN THE ABDOMEN THIGHS OR UPPER ARM ROTATING INJECTION SITES    . telmisartan (MICARDIS) 80 MG tablet   6  . TRESIBA FLEXTOUCH 200 UNIT/ML SOPN Inject 40 Units into the skin every evening.  5  . VASCEPA 1 g CAPS TAKE TWO CAPSULES BY MOUTH TWICE EVERY DAY WITH FOOD SWALLOW WHOLE    . amoxicillin (AMOXIL) 500 MG capsule Take 2,000 mg by mouth once. Before dental procedures (Patient not taking: Reported on 11/14/2020)    . atorvastatin (LIPITOR) 20 MG tablet Take 20 mg by mouth daily. (Patient not taking: Reported on 11/14/2020)    . BD PEN NEEDLE NANO 2ND GEN 32G X 4 MM MISC See admin instructions.    . predniSONE (DELTASONE) 20 MG tablet Take 1 tablet (20 mg total) by mouth 2 (two) times daily with a meal. (Patient not taking: No sig reported) 10 tablet 0  . QUEtiapine (SEROQUEL XR) 300 MG 24 hr tablet Take 2 tablets (600 mg total) by mouth  at bedtime. 60 tablet 1  . tiZANidine (ZANAFLEX) 4 MG tablet Take 1-2 tablets (4-8 mg total) by mouth every 6 (six) hours as needed for muscle spasms. (Patient not taking: Reported on 11/14/2020) 21 tablet 0  . traMADol (ULTRAM) 50 MG tablet Take 1 tablet (50 mg total) by mouth every 6 (six) hours as needed. (Patient not taking: No sig reported) 15 tablet 0   No current facility-administered medications for this visit.    Medication Side Effects: 30# weight gain with the increase in the Latuda.  No change so far with switch back to Seroquel XR.  Allergies:  Allergies  Allergen Reactions  . Decongestant Formula Other (See Comments)    TACHYCARDIA  . Grapefruit Flavor [Flavoring Agent] Other (See Comments)    DRUG INTERACTION WITH LATUDA  . Latex Rash  . Aspirin     UNSPECIFIED REASON Told not take by md years ago  . Nsaids     UNSPECIFIED REASON Told years ago by md not to take  . Adhesive [Tape] Rash  . Oxycodone Itching  . Tramadol Itching  . Vicodin [Hydrocodone-Acetaminophen] Itching    Past Medical History:  Diagnosis Date  . Anemia   . Anxiety   . Arthritis    knees, ankles, hips  . Asthma    daily and prn inhalers; triggered by environmental allergies  . Bipolar disorder ([place])   . Cancer [facility]) 2012   breast, left   . Diabetes mellitus    NIDDM  type2  . Elevated liver enzymes since age 55  . GERD (gastroesophageal reflux disease)    OTC as needed  . Heart murmur    states no known problems  . Hypertension    under control; has been on med. > 12 yrs.  . Sleep apnea    cpap  summer  2018  . TIA (transient ischemic attack) age 78   TIA vs. Bell's palsy(was undetermined) - no residual deficits  . Wears glasses     History reviewed. No pertinent family history.  Social History   Socioeconomic History  . Marital status: Single  Spouse name: Not on file  . Number of children: Not on file  . Years of education: Not on file  . Highest education level:  Not on file  Occupational History  . Not on file  Tobacco Use  . Smoking status: Never Smoker  . Smokeless tobacco: Never Used  Vaping Use  . Vaping Use: Never used  Substance and Sexual Activity  . Alcohol use: Yes    Comment: rarely  . Drug use: No  . Sexual activity: Not on file  Other Topics Concern  . Not on file  Social History Narrative  . Not on file   Social Determinants of Health   Financial Resource Strain: Not on file  Food Insecurity: Not on file  Transportation Needs: Not on file  Physical Activity: Not on file  Stress: Not on file  Social Connections: Not on file  Intimate Partner Violence: Not on file    Past Medical History, Surgical history, Social history, and Family history were reviewed and updated as appropriate.   Please see review of systems for further details on the patient's review from today.   Objective:   Physical Exam:  There were no vitals taken for this visit.  Physical Exam Constitutional:      General: She is not in acute distress.    Appearance: She is well-developed. She is obese.  Musculoskeletal:        General: No deformity.  Neurological:     Mental Status: She is alert and oriented to person, place, and time.     Motor: No tremor.     Coordination: Coordination normal.     Gait: Gait normal.  Psychiatric:        Attention and Perception: She is attentive.        Mood and Affect: Mood is anxious and depressed. Affect is not labile, blunt, angry or inappropriate.        Speech: Speech normal.        Behavior: Behavior normal.        Thought Content: Thought content normal. Thought content does not include homicidal or suicidal ideation. Thought content does not include homicidal or suicidal plan.        Cognition and Memory: Cognition normal.        Judgment: Judgment normal.     Comments: Insight intact. Judgment fair about self care. No auditory or visual hallucinations. No delusions.  More stressed. Talkative on  stress with father     Lab Review:     Component Value Date/Time   NA 134 (L) 10/18/2017 0506   K 4.2 10/18/2017 0506   CL 100 (L) 10/18/2017 0506   CO2 23 10/18/2017 0506   GLUCOSE 90 10/18/2017 0506   BUN 14 10/18/2017 0506   CREATININE 1.01 (H) 10/18/2017 0506   CALCIUM 8.6 (L) 10/18/2017 0506   PROT 7.1 10/15/2017 0902   ALBUMIN 3.6 10/15/2017 0902   AST 30 10/15/2017 0902   ALT 24 10/15/2017 0902   ALKPHOS 36 (L) 10/15/2017 0902   BILITOT 1.2 10/15/2017 0902   GFRNONAA >60 10/18/2017 0506   GFRAA >60 10/18/2017 0506       Component Value Date/Time   WBC 5.4 10/15/2017 0902   RBC 3.88 10/15/2017 0902   HGB 12.7 10/15/2017 0902   HCT 38.2 10/15/2017 0902   PLT 234 10/15/2017 0902   MCV 98.5 10/15/2017 0902   MCH 32.7 10/15/2017 0902   MCHC 33.2 10/15/2017 0902   RDW 13.4 10/15/2017 0902  LYMPHSABS 5.2 (H) 11/14/2010 1053   MONOABS 0.6 11/14/2010 1053   EOSABS 0.1 11/14/2010 1053   BASOSABS 0.0 11/14/2010 1053    No results found for: POCLITH, LITHIUM   No results found for: PHENYTOIN, PHENOBARB, VALPROATE, CBMZ   .res Assessment: Plan:    Bipolar I disorder, current or most recent episode depressed, in partial remission with rapid cycling ([place]) - Plan: QUEtiapine (SEROQUEL XR) 300 MG 24 hr tablet  Generalized anxiety disorder - Plan: QUEtiapine (SEROQUEL XR) 300 MG 24 hr tablet  Insomnia due to mental condition - Plan: QUEtiapine (SEROQUEL XR) 300 MG 24 hr tablet  Caregiver stress  Nightmare  NM and insomnia resolved since switch to Seroquel XR  Greater than 50% of 30 min  face to face time with patient was spent on counseling and coordination of care. We discussed History of bipolar disorder. Feels less depressed and less flat with switch from Latuda to Seroquel XR 400mg  daily. Also anxiety is better also. She had maxed out Latuda 120 mg daily and was recently having some anhedonic depression.  Wellbutrin added caused mood cycling.  She was also  having some excessive spending.  Although symptoms resolved with the switch to Seroquel Xr until recentn increased depression.   Increase quetiapine XR to 600 mg daily bc of history of  good response until the added stress.    Discussed potential metabolic side effects associated with atypical antipsychotics, as well as potential risk for movement side effects. Advised pt to contact office if movement side effects occur.  Discussed risk of weight gain with Seroquel is greater than the risk with Latuda which could have an adverse effect on her diabetes.  She needs closely monitor that and try to guard against weight gain.  She has not gained weight on it. Disc HGBA1C is 4.8. better.  No med change today and she agrees except consider occ BZ due to marked stress.  We discussed the short-term risks associated with benzodiazepines including sedation and increased fall risk among others.  Discussed long-term side effect risk including dependence, potential withdrawal symptoms, and the potential eventual dose-related risk of dementia.  But recent studies from 2020 dispute this association between benzodiazepines and dementia risk. Newer studies in 2020 do not support an association with dementia. Lorazepam 1 mg prn anxiety #30  Supportive and problem solving therapy over marked stressors DT F's declining mental and physical health.  Dementia.  Disc may need to get new psychiatrist bc of insurance.  FU 4-6 weeks  Lynder Parents, MD, DFAPA   Please see After Visit Summary for patient specific instructions.  Future Appointments  Date Time Provider [place]  05/11/2021  1:15 PM Marybelle Killings, MD [facility] None    No orders of the defined types were placed in this encounter.     -------------------------------

## 2020-11-23 ENCOUNTER — Telehealth: Payer: Self-pay

## 2020-11-23 NOTE — Telephone Encounter (Signed)
Received a request for a medication change on QUETIAPINE ER 300 MG #60 due to insurance doesn't cover medication. Submitted a PA through [employer] which has pt's name as Rebecca Rice not [place], ID# [id number]. Once submitted response was pt did not have any pharmacy benefits.  Contacted [facility] [phone] and the pharmacist knew exactly who I was referring to and said it was such a big issue at the store. Pt had [employer] as primary then Medicaid as secondary but each one had a different last name. Pharmacy can only bill through one name. Pt took it upon herself to cancel her primary on the phone at the pharmacy. Which made things worse.  Pharmacist said probably the best thing to do is to have me contact Medicaid [employer] at [phone] ID# [id number] Q. Pharmacist felt this was/is not going to be an easy phone call to try and get a prior authorization on.  Informed pharmacy I would reach out to Medicaid and follow up.

## 2020-12-28 ENCOUNTER — Encounter: Payer: Self-pay | Admitting: Psychiatry

## 2020-12-28 ENCOUNTER — Other Ambulatory Visit: Payer: Self-pay

## 2020-12-28 ENCOUNTER — Ambulatory Visit (INDEPENDENT_AMBULATORY_CARE_PROVIDER_SITE_OTHER): Payer: Medicaid Other | Admitting: Psychiatry

## 2020-12-28 DIAGNOSIS — Z636 Dependent relative needing care at home: Secondary | ICD-10-CM | POA: Diagnosis not present

## 2020-12-28 DIAGNOSIS — F411 Generalized anxiety disorder: Secondary | ICD-10-CM

## 2020-12-28 DIAGNOSIS — F3175 Bipolar disorder, in partial remission, most recent episode depressed: Secondary | ICD-10-CM | POA: Diagnosis not present

## 2020-12-28 DIAGNOSIS — F515 Nightmare disorder: Secondary | ICD-10-CM

## 2020-12-28 DIAGNOSIS — F5105 Insomnia due to other mental disorder: Secondary | ICD-10-CM | POA: Diagnosis not present

## 2020-12-28 MED ORDER — QUETIAPINE FUMARATE 300 MG PO TABS: 300.0000 mg | ORAL_TABLET | Freq: Every day | ORAL | 0 refills | Status: DC

## 2020-12-28 MED ORDER — QUETIAPINE FUMARATE ER 300 MG PO TB24: 300.0000 mg | ORAL_TABLET | Freq: Every day | ORAL | 0 refills | Status: DC

## 2020-12-28 NOTE — Telephone Encounter (Signed)
Quetiapine ER 300 mg tab 2 HS approved and received

## 2020-12-28 NOTE — Progress Notes (Signed)
Rebecca Rice [id number] 08/10/1970 50 y.o.  Subjective:   Patient ID:  Rebecca Rice is a 50 y.o. (DOB 1970-10-03) female.  Chief Complaint:  Chief Complaint  Patient presents with  . Follow-up  . Bipolar I disorder, current or most recent episode depresse  . Anxiety  . Depression    Depression        Associated symptoms include no decreased concentration, no fatigue and no suicidal ideas.   Rebecca Rice presents to the office today for follow-up of bipolar depression.   When seen March 26, 2019.  The patient was complaining of flatness with some depression.  There was concern that the high dose Latuda may be flattening her somewhat and so it was reduced from 120 mg to 80 mg.  We discussed the risk of developing mania.  seen October 2020.  For anhedonic depression we made the following changes: Increase Latuda to 120 mg daily. Trial Wellbutrin XL 150 mg tablets 1 each AM The following was noted: Increased spending continues. Mood swings worse.   More energy at times and roller coaster with mood, energy and activity.  Read a book which is progress.  Overactivity at times like yesterday but Sat couldn't get out of bed.  Cycles daily 1 day real up and next day worn out. Cycles with sleep as well. If up late surfing on internet to buy or reorganizing closet or cleaning.  Trouble finishing things.   Spending time with parents.  F has dementia. betterl anhedonia.  seen December 2020.  As noted she was mood cycling with some manic symptoms.  Because it was not being controlled by the Latuda the decision was made to switch back to Seroquel which had been helpful in the past.  The Latuda was weaned, trazodone and Wellbutrin were stopped, and Seroquel XR was added and increased to 400 mg every afternoon. Pleased she has less spending.  Sometimes still a little too exuberant but not bad.  Others have noted her being "very  Happy".  Usually 7 -8 hours but work schedule dependent.  No depression  problems since here.  Surprising.  No SE so far.  Last seen January 2020.  She was doing relatively well and no meds were changed.  As of March 30.  Emotionally OK despite stress.  Seroquel is doing it's job so no complaints. Not signifcantly depressed.  Sleep OK usually except when stressed.  Big stressor dealing with parents who both have Alzheimer's.  Needs to be guardian and have them placed.  Patient denies any recent difficulty with anxiety.  No further bad dreams.  Denies appetite disturbance.  Patient reports that energy and motivation have been reduced.  Patient denies any difficulty with concentration.  Patient denies any suicidal ideation. No menses in 2 years but did this month. 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 Changed her mind and thinking of respiratory therapy school but it would take 2 years.  She's helping care for her parents.  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 and going to school.  She feels she needs to change career to make more money.  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 Good grades in school. Plan: no med changes  05/16/2020 appointment with the following noted: 5/14 Mo had SZ and father has dementia and SZ lasted 2 hours. MI and then passed away.  She's only child so had to quit work and care for father.  She's only child.  No one to care  for father.  Has open worker's comp case for back injury.  Can't get help with father bc of Dentist.  Has to do repairs on the house.  Looking on work from home job. More stressed than  Depressed.  Doesn't have the money to fix her house and couldn't sell it without the repairs. Has to try to get guardianship for father and doesn't have the money. Can't say Seroquel not working.  Father's sleep is erratic therefore her sleep is erratic.  He has a day care which is hard to get him to.   He thinks pt is his wife. A lot of anger that parents didn't prepare for this when she asked them  to do it. Trying Aswaganda to help with anxiety and seems to help. Lost from 221# to 182# over a year. Plan: Continue Seroquel XR 400 daily with no med changes.  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   May need to find another doctor.  Not sure how long she'll  Be on Medicaid. Taking care of father and may need placement.  New partner Gerald Stabs notices she's neglecting things and not reading as much with reduced interest.   Recent SI in Feb with depression and stressed out.  Not that bad now but depression still there.   Cannot move father bc of reinjured back and shoulder.  She had promised she wouldn't put him in home but now has been told it's necessary per her doctor's. Spent all her money saved on the house and situation.  House has no HVAC and needs roof.  Cars need to be sold and house and will leave her homeless. Gerald Stabs has been helpful. May be sleeping more than normal but has to get up at night with father. Plan: Increase quetiapine XR to 600 mg daily bc of history of  good response until the added stress.  12/28/2020 appointment with the following noted: Patient had difficulty getting the increased dose of quetiapine due to insurance problems. Now has just Medicaid and some version of Medicare. Increase Seroquel is helping with clarity of thought and a bit less anxious but still anxious. Father is being placed and that will help.  Awkward position.  Can't settle mother's estate bc father is incompetent.  Feels trapped. Realizes she's compounded the problem relationally to some degree.  Wonders if she's pregnant. SE tiredness Seroquel and increased craving of CHO.    Average to bed 1130 p and F awakens middle of night.   Has sleep apnea. No history of addiction to drugs.  Past Psychiatric M edication Trials:  lithium, VPA, carbamazepine Seroquel worked but SE,  risperidone, Latuda  120 Wellbutrin mood cycling Trazodone  Review of Systems:  Review of Systems  Constitutional: Negative for fatigue.  Cardiovascular: Negative for chest pain and palpitations.  Endocrine: Negative for polyuria.  Neurological: Negative for tremors, weakness and light-headedness.  Psychiatric/Behavioral: Positive for depression. Negative for agitation, behavioral problems, confusion, decreased concentration, dysphoric mood, hallucinations, self-injury, sleep disturbance and suicidal ideas. The patient is nervous/anxious. The patient is not hyperactive.     Medications: I have reviewed the patient's current medications.  Current Outpatient Medications  Medication Sig Dispense Refill  . albuterol (PROVENTIL HFA;VENTOLIN HFA) 108 (90 BASE) MCG/ACT inhaler Inhale 2 puffs into the lungs every 6 (six) hours as needed for wheezing or shortness of breath.     . APPLE CIDER VINEGAR PO Take by mouth.    [name]  ASHWAGANDHA PO Take by mouth.    Darlin Priestly HFA 100 MCG/ACT AERO SMARTSIG:2 Puff(s) Via Inhaler Morning-Night    . atorvastatin (LIPITOR) 10 MG tablet Take 10 mg by mouth daily.    . BD PEN NEEDLE NANO 2ND GEN 32G X 4 MM MISC See admin instructions.    . cetirizine (ZYRTEC) 10 MG tablet Take 10 mg by mouth daily as needed for allergies.     . cloNIDine (CATAPRES) 0.1 MG tablet Take 0.1 mg by mouth at bedtime.     . COLLAGEN PO Take by mouth.    . Fenugreek 500 MG CAPS Take by mouth.    . hydrochlorothiazide (HYDRODIURIL) 25 MG tablet TAKE 1 TABLET BY MOUTH ONCE DAILY FOR 30 DAYS    . JARDIANCE 25 MG TABS tablet Take 25 mg by mouth every morning.    [name] LORazepam (ATIVAN) 1 MG tablet Take 1 tablet (1 mg total) by mouth every 8 (eight) hours. 30 tablet 0  . Maca Root (MACA PO) Take 2,000 mg by mouth.    Edyth Gunnels Oleifera (MORINGA PO) Take by mouth.    . Multiple Minerals-Vitamins (CALCIUM-MAGNESIUM-ZINC-D3 PO) Take by mouth.    . Multiple Vitamins-Minerals (ALIVE ONCE DAILY WOMENS PO) Take by mouth.     [name] OZEMPIC, 1 MG/DOSE, 2 MG/1.5ML SOPN INJECT 1MG  EVERY WEEK ON THE SAME DAY OF EACH WEEK IN THE ABDOMEN THIGHS OR UPPER ARM ROTATING INJECTION SITES    . QUEtiapine (SEROQUEL XR) 300 MG 24 hr tablet Take 2 tablets (600 mg total) by mouth at bedtime. 60 tablet 1  . Schisandra 580 MG CAPS Take by mouth.    . telmisartan (MICARDIS) 80 MG tablet   6  . TRESIBA FLEXTOUCH 200 UNIT/ML SOPN Inject 40 Units into the skin every evening.  5  . VASCEPA 1 g CAPS TAKE TWO CAPSULES BY MOUTH TWICE EVERY DAY WITH FOOD SWALLOW WHOLE    . predniSONE (DELTASONE) 20 MG tablet Take 1 tablet (20 mg total) by mouth 2 (two) times daily with a meal. (Patient not taking: No sig reported) 10 tablet 0  . tiZANidine (ZANAFLEX) 4 MG tablet Take 1-2 tablets (4-8 mg total) by mouth every 6 (six) hours as needed for muscle spasms. (Patient not taking: No sig reported) 21 tablet 0  . traMADol (ULTRAM) 50 MG tablet Take 1 tablet (50 mg total) by mouth every 6 (six) hours as needed. (Patient not taking: Reported on 12/28/2020) 15 tablet 0   No current facility-administered medications for this visit.    Medication Side Effects: 30# weight gain with the increase in the Latuda.  No change so far with switch back to Seroquel XR.  Allergies:  Allergies  Allergen Reactions  . Decongestant Formula Other (See Comments)    TACHYCARDIA  . Grapefruit Flavor [Flavoring Agent] Other (See Comments)    DRUG INTERACTION WITH LATUDA  . Latex Rash  . Aspirin     UNSPECIFIED REASON Told not take by md years ago  . Nsaids     UNSPECIFIED REASON Told years ago by md not to take  . Adhesive [Tape] Rash  . Oxycodone Itching  . Tramadol Itching  . Vicodin [Hydrocodone-Acetaminophen] Itching    Past Medical History:  Diagnosis Date  . Anemia   . Anxiety   . Arthritis    knees, ankles, hips  . Asthma    daily and prn inhalers; triggered by environmental allergies  . Bipolar disorder ([place])   . Cancer [facility]) 2012  breast, left   .  Diabetes mellitus    NIDDM  type2  . Elevated liver enzymes since age 4  . GERD (gastroesophageal reflux disease)    OTC as needed  . Heart murmur    states no known problems  . Hypertension    under control; has been on med. > 12 yrs.  . Sleep apnea    cpap  summer  2018  . TIA (transient ischemic attack) age 39   TIA vs. Bell's palsy(was undetermined) - no residual deficits  . Wears glasses     History reviewed. No pertinent family history.  Social History   Socioeconomic History  . Marital status: Single    Spouse name: Not on file  . Number of children: Not on file  . Years of education: Not on file  . Highest education level: Not on file  Occupational History  . Not on file  Tobacco Use  . Smoking status: Never Smoker  . Smokeless tobacco: Never Used  Vaping Use  . Vaping Use: Never used  Substance and Sexual Activity  . Alcohol use: Yes    Comment: rarely  . Drug use: No  . Sexual activity: Not on file  Other Topics Concern  . Not on file  Social History Narrative  . Not on file   Social Determinants of Health   Financial Resource Strain: Not on file  Food Insecurity: Not on file  Transportation Needs: Not on file  Physical Activity: Not on file  Stress: Not on file  Social Connections: Not on file  Intimate Partner Violence: Not on file    Past Medical History, Surgical history, Social history, and Family history were reviewed and updated as appropriate.   Please see review of systems for further details on the patient's review from today.   Objective:   Physical Exam:  There were no vitals taken for this visit.  Physical Exam Constitutional:      General: She is not in acute distress.    Appearance: She is well-developed. She is obese.  Musculoskeletal:        General: No deformity.  Neurological:     Mental Status: She is alert and oriented to person, place, and time.     Motor: No tremor.     Coordination: Coordination normal.      Gait: Gait normal.  Psychiatric:        Attention and Perception: She is attentive.        Mood and Affect: Mood is anxious and depressed. Affect is not labile, blunt, angry or inappropriate.        Speech: Speech normal.        Behavior: Behavior normal.        Thought Content: Thought content normal. Thought content does not include homicidal or suicidal ideation. Thought content does not include homicidal or suicidal plan.        Cognition and Memory: Cognition normal.        Judgment: Judgment normal.     Comments: Insight intact. Judgment fair about self care. No auditory or visual hallucinations. No delusions.  More stressed. Mood is some better at Seroquel 600 mg Talkative on stress with father     Lab Review:     Component Value Date/Time   NA 134 (L) 10/18/2017 0506   K 4.2 10/18/2017 0506   CL 100 (L) 10/18/2017 0506   CO2 23 10/18/2017 0506   GLUCOSE 90 10/18/2017 0506   BUN 14 10/18/2017  0506   CREATININE 1.01 (H) 10/18/2017 0506   CALCIUM 8.6 (L) 10/18/2017 0506   PROT 7.1 10/15/2017 0902   ALBUMIN 3.6 10/15/2017 0902   AST 30 10/15/2017 0902   ALT 24 10/15/2017 0902   ALKPHOS 36 (L) 10/15/2017 0902   BILITOT 1.2 10/15/2017 0902   GFRNONAA >60 10/18/2017 0506   GFRAA >60 10/18/2017 0506       Component Value Date/Time   WBC 5.4 10/15/2017 0902   RBC 3.88 10/15/2017 0902   HGB 12.7 10/15/2017 0902   HCT 38.2 10/15/2017 0902   PLT 234 10/15/2017 0902   MCV 98.5 10/15/2017 0902   MCH 32.7 10/15/2017 0902   MCHC 33.2 10/15/2017 0902   RDW 13.4 10/15/2017 0902   LYMPHSABS 5.2 (H) 11/14/2010 1053   MONOABS 0.6 11/14/2010 1053   EOSABS 0.1 11/14/2010 1053   BASOSABS 0.0 11/14/2010 1053    No results found for: POCLITH, LITHIUM   No results found for: PHENYTOIN, PHENOBARB, VALPROATE, CBMZ   .res Assessment: Plan:    Bipolar I disorder, current or most recent episode depressed, in partial remission with rapid cycling ([place])  Generalized anxiety  disorder  Insomnia due to mental condition  Caregiver stress  Nightmare  NM and insomnia resolved since switch to Seroquel XR  Greater than 50% of 30 min  face to face time with patient was spent on counseling and coordination of care. We discussed History of bipolar disorder. Her mood benefit with increase in Seroquel XR from 400mg  to 600 mg nightly including less depression and less anxiety and better clarity of thought and resolution of suicidal thoughts.  However she is having some hangover effects throughout the day and increased carbohydrate craving   Better with Increase quetiapine XR to 600 mg daily and history of  good response but more SE.  Will try splitting SR 300 PM with IR 300 mg to reduce current SE.    Discussed potential metabolic side effects associated with atypical antipsychotics, as well as potential risk for movement side effects. Advised pt to contact office if movement side effects occur.  Discussed risk of weight gain with Seroquel is greater than the risk with Latuda which could have an adverse effect on her diabetes.  She needs closely monitor that and try to guard against weight gain.  She has not gained weight on it. Disc HGBA1C is 4.8. better. Disc risk in pregnancy of current psych meds.   We discussed the short-term risks associated with benzodiazepines including sedation and increased fall risk among others.  Discussed long-term side effect risk including dependence, potential withdrawal symptoms, and the potential eventual dose-related risk of dementia.  But recent studies from 2020 dispute this association between benzodiazepines and dementia risk. Newer studies in 2020 do not support an association with dementia. Lorazepam 1 mg prn anxiety #30  Supportive and problem solving therapy over marked stressors DT F's declining mental and physical health.  Dementia. And the estate issue. Potentially homeless.  Disc may need to get new psychiatrist bc of  insurance.  FU 6 weeks  Lynder Parents, MD, DFAPA   Please see After Visit Summary for patient specific instructions.  Future Appointments  Date Time Provider [place]  05/11/2021  1:15 PM Marybelle Killings, MD [facility] None    No orders of the defined types were placed in this encounter.     -------------------------------

## 2021-01-24 ENCOUNTER — Other Ambulatory Visit: Payer: Self-pay | Admitting: Psychiatry

## 2021-01-24 DIAGNOSIS — F3175 Bipolar disorder, in partial remission, most recent episode depressed: Secondary | ICD-10-CM

## 2021-01-25 ENCOUNTER — Other Ambulatory Visit: Payer: Self-pay

## 2021-01-25 DIAGNOSIS — F3175 Bipolar disorder, in partial remission, most recent episode depressed: Secondary | ICD-10-CM

## 2021-01-25 DIAGNOSIS — F5105 Insomnia due to other mental disorder: Secondary | ICD-10-CM

## 2021-01-25 DIAGNOSIS — F411 Generalized anxiety disorder: Secondary | ICD-10-CM

## 2021-01-25 MED ORDER — QUETIAPINE FUMARATE ER 300 MG PO TB24: 300.0000 mg | ORAL_TABLET | Freq: Every day | ORAL | 0 refills | Status: AC

## 2021-02-27 ENCOUNTER — Other Ambulatory Visit: Payer: Self-pay

## 2021-02-27 DIAGNOSIS — F3175 Bipolar disorder, in partial remission, most recent episode depressed: Secondary | ICD-10-CM

## 2021-02-27 MED ORDER — QUETIAPINE FUMARATE 300 MG PO TABS: ORAL_TABLET | ORAL | 0 refills | Status: AC

## 2021-02-28 ENCOUNTER — Other Ambulatory Visit: Payer: Self-pay | Admitting: Psychiatry

## 2021-02-28 DIAGNOSIS — F3175 Bipolar disorder, in partial remission, most recent episode depressed: Secondary | ICD-10-CM

## 2021-03-22 ENCOUNTER — Ambulatory Visit: Payer: Medicaid Other | Admitting: Psychiatry

## 2021-05-11 ENCOUNTER — Other Ambulatory Visit: Payer: Self-pay

## 2021-05-11 ENCOUNTER — Encounter: Payer: Self-pay | Admitting: Orthopaedic Surgery

## 2021-05-11 ENCOUNTER — Ambulatory Visit (INDEPENDENT_AMBULATORY_CARE_PROVIDER_SITE_OTHER): Payer: 59 | Admitting: Orthopaedic Surgery

## 2021-05-11 VITALS — BP 113/75 | HR 97 | Ht 62.0 in | Wt 172.0 lb

## 2021-05-11 DIAGNOSIS — Z9889 Other specified postprocedural states: Secondary | ICD-10-CM | POA: Diagnosis not present

## 2021-05-11 DIAGNOSIS — M5126 Other intervertebral disc displacement, lumbar region: Secondary | ICD-10-CM | POA: Diagnosis not present

## 2021-05-11 NOTE — Progress Notes (Signed)
Office Visit Note   Patient: Rebecca Rice           Date of Birth: 04/22/1971           MRN: [id number] Visit Date: 05/11/2021              Requested by: Jilda Panda, MD [id number] [place],  [place] [id number] PCP: Jilda Panda, MD   Assessment & Plan: Visit Diagnoses:  1. Status post lumbar microdiscectomy   2. Protrusion of lumbar intervertebral disc     Plan: I discussed with her despite her previous experience when she had an epidural and states it was extremely painful and she also mentioned that her acromioclavicular joint injection was extremely painful she could consider 1 and a repeat epidural injection left L5-S1 see if she gets some significant improvement.  She has a different attorney helping with disability application and she mentions that she has some other problems that make work a problem for her.  She like to proceed with a single epidural she can call let me know.  She does have some subarticular narrowing and mild impingement on the left at L5-S1.  We discussed treatment options available surgically as well as problems with loading that just above which have some mild protrusion.  She will call if she like proceed with the single epidural and if so she can follow-up with me after the injection.  Patient requested a 55-month parking handicap placard which was supplied.  Follow-Up Instructions: Return if symptoms worsen or fail to improve.   Orders:  No orders of the defined types were placed in this encounter.  No orders of the defined types were placed in this encounter.     Procedures: No procedures performed   Clinical Data: No additional findings.   Subjective: Chief Complaint  Patient presents with   Lower Back - Follow-up    OTJI 10/03/2019    HPI 50 year old female returns.  She states her father is now in [facility] care facility doing well.  She states she has been told her claim was denied she has an attorney helping her with her case.  She  continues to have problems with back pain leg pain problems.  States she hurts when she is in a car for more than 20 minutes she is able to ride her bicycle only 20 minutes and then has increased left lower extremity symptoms.  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  Previous surgery 2019 left L5-S1 .  Today's visit we reviewed her MRI from [facility] scanner which showed some disc osteophyte L5-S1 some on the left slight central.  Some mild right disc protrusion L4-5 and also L3-4.  Patient states she still has problems with her shoulder which had right acromioclavicular joint synovitis.  Rotator cuff and glenohumeral joint was normal on that MRI.  We reviewed those scans today on PACS.  Review of Systems Updated unchanged.  Objective: Vital Signs: BP 113/75   Pulse 97   Ht 5\' 2"  (1.575 m)   Wt 172 lb (78 kg)   BMI 31.46 kg/m   Physical Exam Constitutional:      Appearance: She is well-developed.  HENT:     Head: Normocephalic.     Right Ear: External ear normal.     Left Ear: External ear normal. There is no impacted cerumen.  Eyes:  Pupils: Pupils are equal, round, and reactive to light.  Neck:     Thyroid: No thyromegaly.     Trachea: No tracheal deviation.  Cardiovascular:     Rate and Rhythm: Normal rate.  Pulmonary:     Effort: Pulmonary effort is normal.  Abdominal:     Palpations: Abdomen is soft.  Musculoskeletal:     Cervical back: No rigidity.  Skin:    General: Skin is warm and dry.  Neurological:     Mental Status: She is alert and oriented to person, place, and time.  Psychiatric:        Behavior: Behavior normal.    Ortho Exam patient is ambulatory heel toe gait.  No rash over exposed skin.  Specialty Comments:  No specialty comments available.  Imaging: No results found.   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 > 12 yrs.   Sleep apnea    cpap  summer  2018   TIA (transient ischemic attack) age 72   TIA vs. Bell's palsy(was undetermined) - no residual deficits   Wears glasses     No family history on file.  Past Surgical History:  Procedure Laterality Date   BACK SURGERY  10/17/2017   BREAST CAPSULECTOMY WITH IMPLANT EXCHANGE  06/24/2012   Procedure: BREAST CAPSULECTOMY WITH IMPLANT EXCHANGE;  Surgeon: Theodoro Kos, DO;  Location: [facility];  Service: Plastics;  Laterality: Bilateral;  removal of bilateral breast expanders capsulectomies and placement of implants     BREAST IMPLANT EXCHANGE Bilateral 07/05/2013   Procedure: REMOVAL OF BILATERAL IMPLANTS REPLACEMENT OF BILATERAL IMPLANTS BILATERAL  ;  Surgeon: Cristine Polio, MD;  Location: [facility];  Service: Plastics;  Laterality: Bilateral;   BREAST RECONSTRUCTION  10/17/2011   Procedure: BREAST RECONSTRUCTION;  Surgeon: Theodoro Kos, DO;  Location: [facility];  Service: Plastics;  Laterality: Bilateral;  bilateral breast reconstruction with expander and flex hd placement   BREAST RECONSTRUCTION N/A 04/18/2014   Procedure: LOWER OF RIGHT INFRA MAMMARY FOLD/RIGHT AND LEFT NIPPLE AREOLA COMPLEX RECONSTRUCTION Additional  Normal saline added to right breast implant;  Surgeon: Cristine Polio, MD;  Location: [facility];  Service: Plastics;  Laterality: N/A;   FOOT SURGERY Bilateral 01/2010; [place] WITH INTRAOPERATIVE CHOLANGIOGRAM N/A 12/29/2015   Procedure: LAPAROSCOPIC CHOLECYSTECTOMY SINGLE SITE WITH INTRA OP CHOLANGIOGRAM ;  Surgeon: Michael Boston, MD;  Location: [facility];  Service: General;  Laterality: N/A;   LASIK Bilateral 2002   LIVER BIOPSY N/A 12/29/2015   Procedure: NEEDLE CORE LIVER BIOPSY;  Surgeon: Michael Boston, MD;  Location: [facility];  Service: General;  Laterality: N/A;   LUMBAR LAMINECTOMY/DECOMPRESSION MICRODISCECTOMY N/A 10/17/2017   Procedure: LEFT L5-S1 MICRODISCECTOMY;  Surgeon: Marybelle Killings, MD;  Location: [facility];  Service: Orthopedics;  Laterality: N/A;   MASTECTOMY  11/21/2010   bilat. simple mastect.lt snbx   MASTOPEXY Bilateral 02/21/2014   Procedure: SCAR REVISION RIGHT AND LEFT BREAST;  Surgeon: Cristine Polio, MD;  Location: [facility];  Service: Plastics;  Laterality: Bilateral;   SCAR REVISION Bilateral 07/05/2013   Procedure: SCAR REVISION BILATERAL EXCISION OF SEVERE EXCESSIVE BREAST TISSUE ;  Surgeon: Cristine Polio, MD;  Location: [facility];  Service: Plastics;  Laterality: Bilateral;   WISDOM TOOTH EXTRACTION  age 82   Social History   Occupational History   Not on file  Tobacco Use   Smoking status: Never   Smokeless tobacco: Never  Vaping Use   Vaping Use: Never used  Substance and Sexual Activity   Alcohol use: Yes    Comment: rarely   Drug use: No   Sexual activity: Not on file

## 2021-05-24 ENCOUNTER — Telehealth: Payer: Self-pay | Admitting: Orthopaedic Surgery

## 2021-05-24 NOTE — Telephone Encounter (Signed)
Pt called requesting a referral for injection at [facility]. Please call pt about this matter at [phone]. Call when referral has been sent to [facility]

## 2021-05-25 ENCOUNTER — Other Ambulatory Visit: Payer: Self-pay

## 2021-05-25 DIAGNOSIS — Z9889 Other specified postprocedural states: Secondary | ICD-10-CM

## 2021-05-25 DIAGNOSIS — M5126 Other intervertebral disc displacement, lumbar region: Secondary | ICD-10-CM

## 2021-05-25 NOTE — Telephone Encounter (Signed)
Referral sent 

## 2021-06-12 ENCOUNTER — Inpatient Hospital Stay
Admission: RE | Admit: 2021-06-12 | Discharge: 2021-06-12 | Disposition: A | Discharge status: Home / Self Care | Payer: Medicaid Other | Source: Ambulatory Visit | Attending: Orthopaedic Surgery | Admitting: Orthopaedic Surgery

## 2021-06-12 NOTE — Discharge Instructions (Signed)

## 2021-08-30 ENCOUNTER — Other Ambulatory Visit: Payer: Self-pay | Admitting: Physician Assistant

## 2021-08-30 DIAGNOSIS — R109 Unspecified abdominal pain: Secondary | ICD-10-CM

## 2021-08-30 DIAGNOSIS — Z853 Personal history of malignant neoplasm of breast: Secondary | ICD-10-CM

## 2021-09-19 ENCOUNTER — Ambulatory Visit
Admission: RE | Admit: 2021-09-19 | Discharge: 2021-09-19 | Disposition: A | Discharge status: Home / Self Care | Payer: Medicaid Other | Source: Ambulatory Visit | Attending: Physician Assistant | Admitting: Physician Assistant

## 2021-09-19 ENCOUNTER — Other Ambulatory Visit: Payer: Self-pay

## 2021-09-19 DIAGNOSIS — Z853 Personal history of malignant neoplasm of breast: Secondary | ICD-10-CM

## 2021-09-19 DIAGNOSIS — R109 Unspecified abdominal pain: Secondary | ICD-10-CM

## 2021-09-19 MED ORDER — IOPAMIDOL (ISOVUE-300) INJECTION 61%
100.0000 mL | Freq: Once | INTRAVENOUS | Status: AC | PRN
Start: 2021-09-19 — End: 2021-09-19
  Administered 2021-09-19: 100 mL via INTRAVENOUS

## 2024-01-12 IMAGING — CT CT ABD-PELV W/ CM
1 of 3 series · 12 of 32 positions shown, 18 images · IV contrast (APPLIED)
Comparison: Ultrasound from 11/15/2015

CLINICAL DATA: Abdominal pain for several years.

EXAM:
CT ABDOMEN AND PELVIS WITH CONTRAST
TECHNIQUE: Multidetector CT imaging of the abdomen and pelvis was performed
using the standard protocol following bolus administration of
intravenous contrast.

[Series 2: abd/pelvis w/cm · axial · 0.85mm/px · z∈[-387,-22]mm · 12 of 87 slices shown, 18 images]
[im 7/87  soft-tissue]
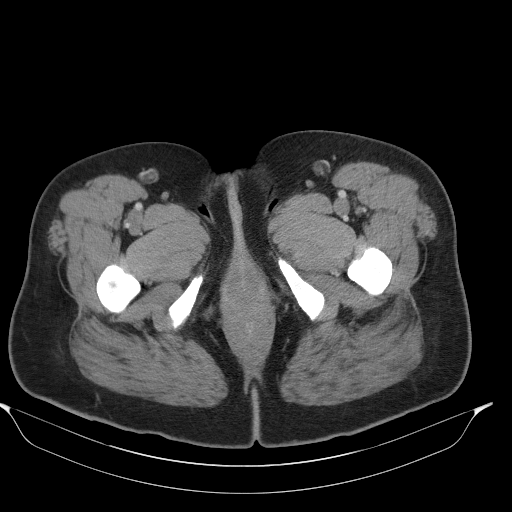
[im 7/87  bone]
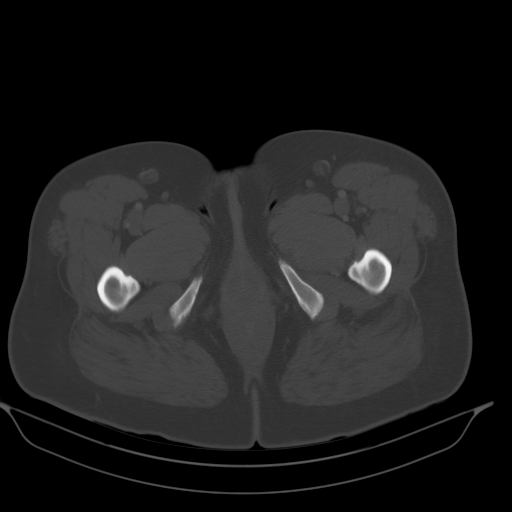
[im 14/87  soft-tissue]
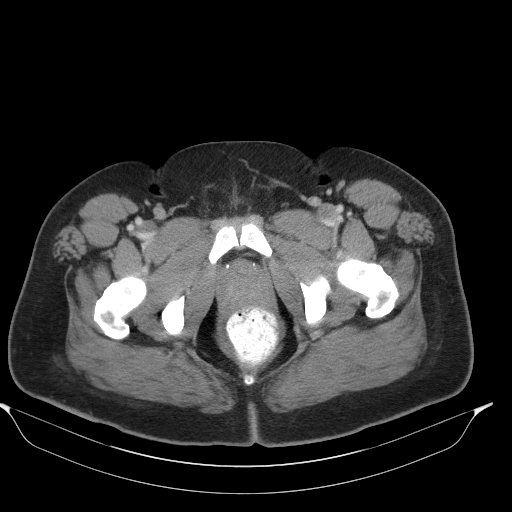
[im 20/87  soft-tissue]
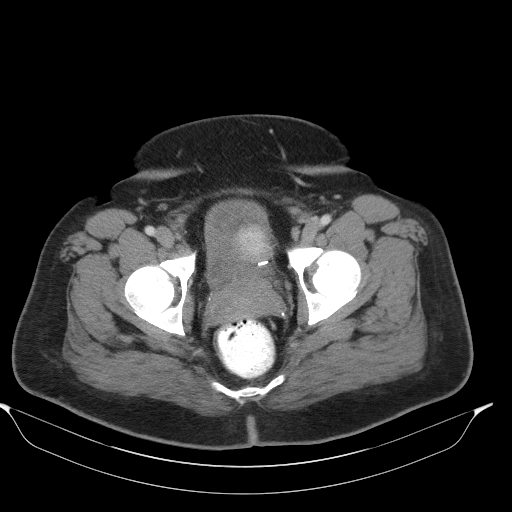
[im 27/87  soft-tissue]
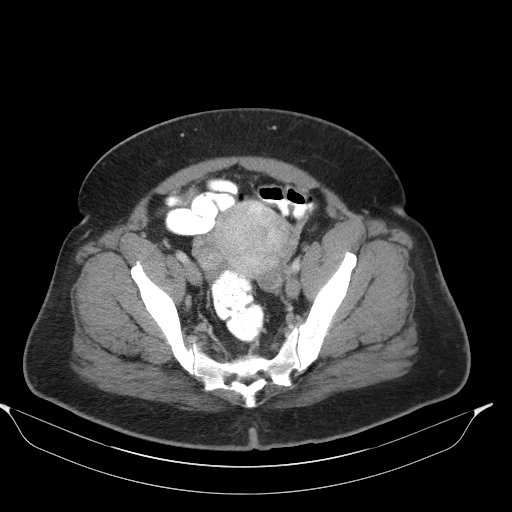
[im 34/87  soft-tissue]
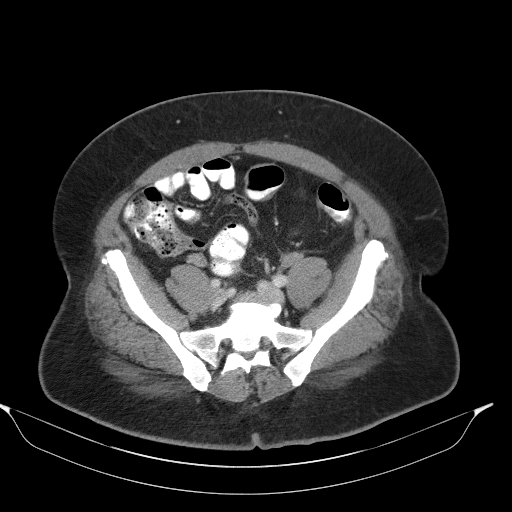
[im 40/87  soft-tissue]
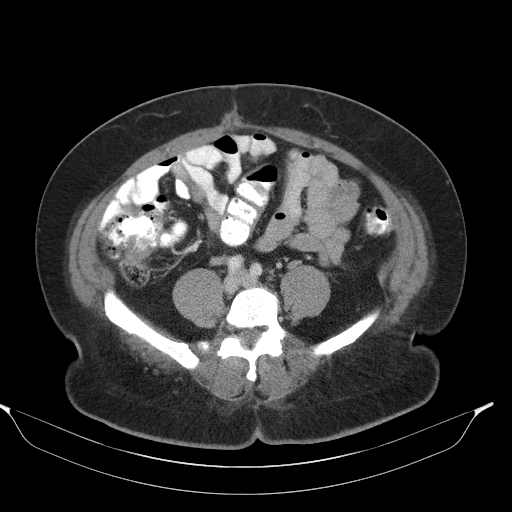
[im 47/87  soft-tissue]
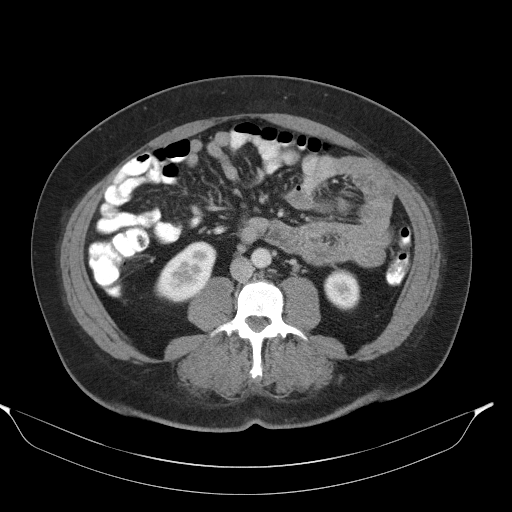
[im 53/87  soft-tissue]
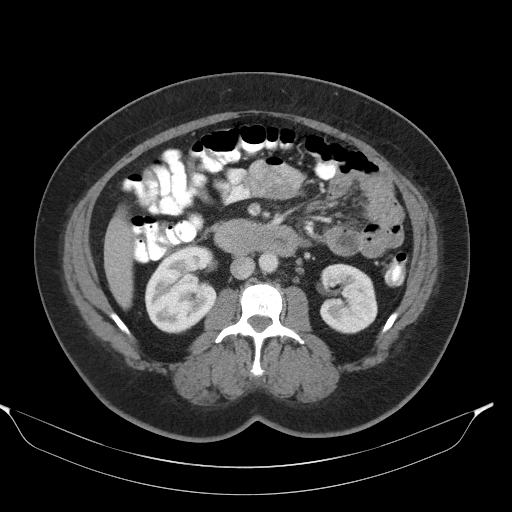
[im 60/87  soft-tissue]
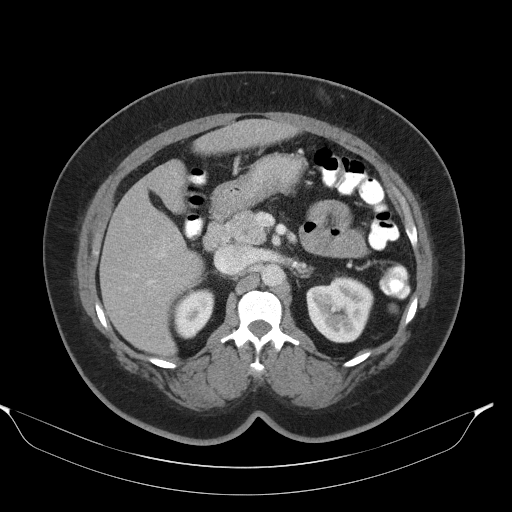
[im 60/87  lung]
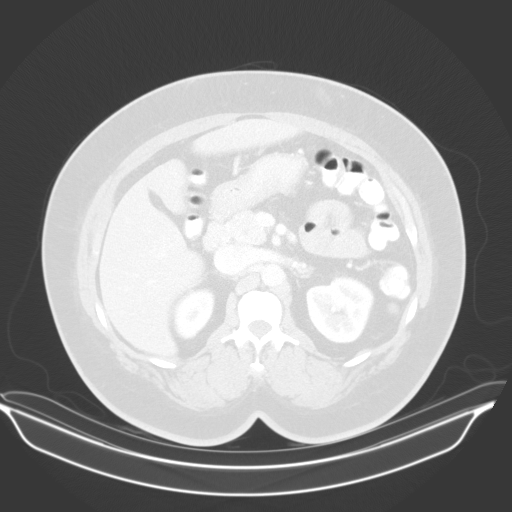
[im 60/87  bone]
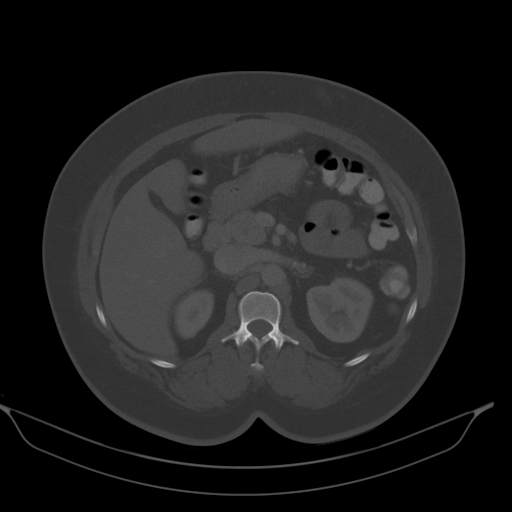
[im 67/87  soft-tissue]
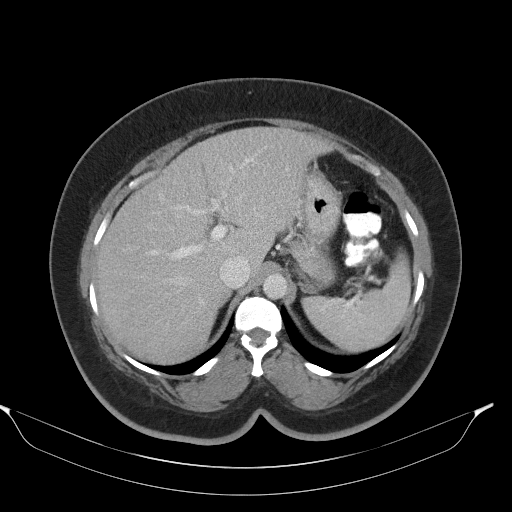
[im 67/87  lung]
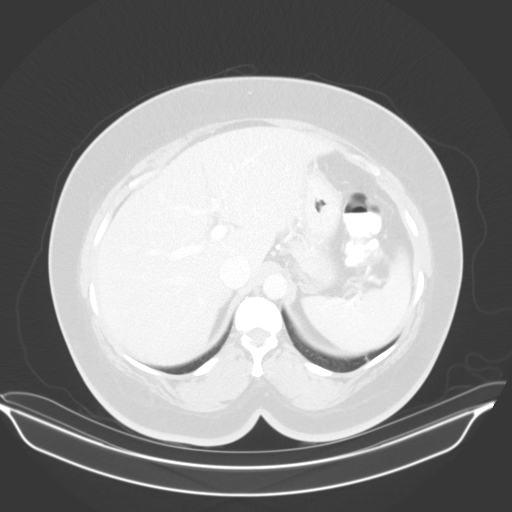
[im 73/87  soft-tissue]
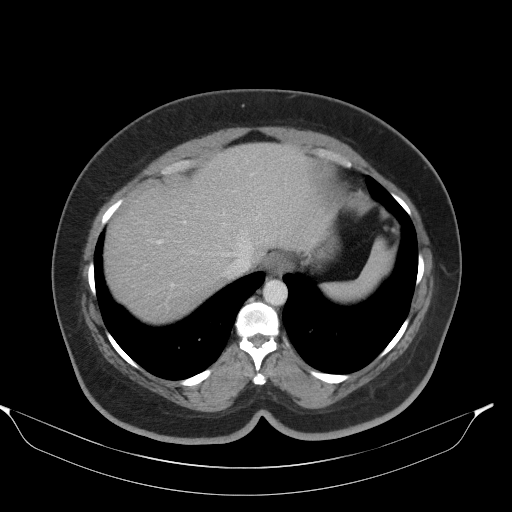
[im 73/87  lung]
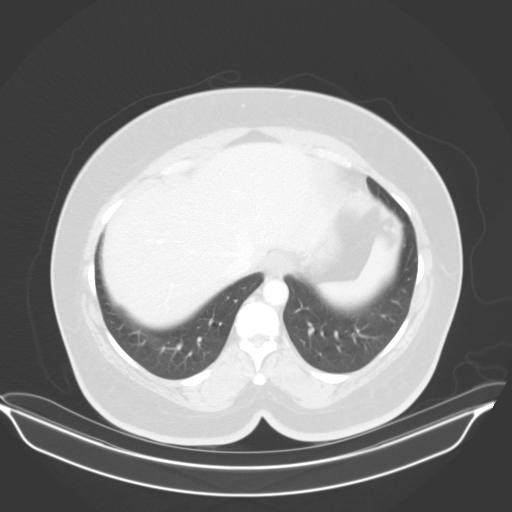
[im 80/87  soft-tissue]
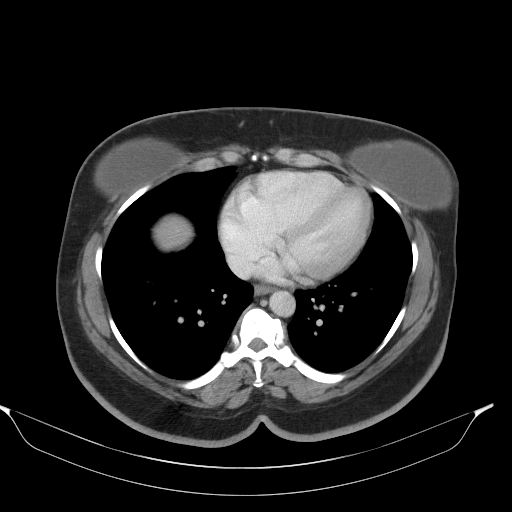
[im 80/87  lung]
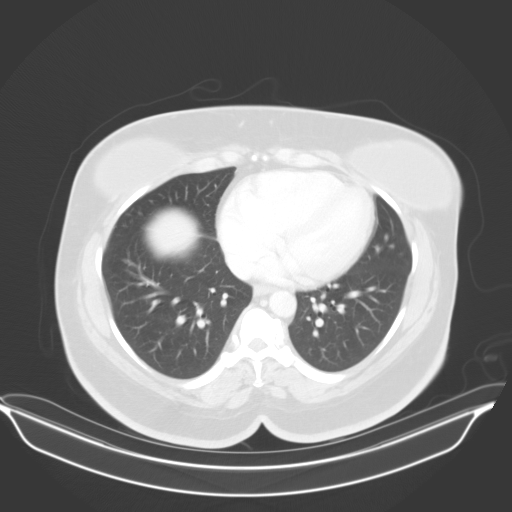

[12 of 32 positions shown; findings below may reference images not displayed]

RADIATION DOSE REDUCTION: This exam was performed according to the
departmental dose-optimization program which includes automated
exposure control, adjustment of the mA and/or kV according to
patient size and/or use of iterative reconstruction technique.

CONTRAST:  100mL ZXBD7S-[id number] IOPAMIDOL (ZXBD7S-KRR) INJECTION 61%
FINDINGS: Lower chest: Lung bases are free of acute infiltrate or sizable
effusion. Bilateral breast implants are noted consistent with the
given clinical history.

Hepatobiliary: Gallbladder has been surgically removed. Liver is
mildly fatty infiltrated.

Pancreas: Unremarkable. No pancreatic ductal dilatation or
surrounding inflammatory changes.

Spleen: Normal in size without focal abnormality.

Adrenals/Urinary Tract: Adrenal glands are within normal limits. The
kidneys demonstrate a normal enhancement pattern bilaterally. No
renal calculi or obstructive changes are seen. Normal excretion is
noted bilaterally. The bladder is decompressed.

Stomach/Bowel: No obstructive or inflammatory changes of colon are
seen. The appendix is within normal limits. Small bowel and stomach
are unremarkable.

Vascular/Lymphatic: No significant vascular findings are present. No
enlarged abdominal or pelvic lymph nodes.

Reproductive: Mild heterogeneity of the uterus is noted which may be
related to a degree of leiomyomatous change. No discrete fibroid is
seen. Adnexa appear within normal limits.

Other: No abdominal wall hernia or abnormality. No abdominopelvic
ascites.

Musculoskeletal: No acute or significant osseous findings.
IMPRESSION: No acute abnormality to correspond with the patient's given clinical
history.

Mild heterogeneity of the uterus is noted which may be related to
underlying fibroid change although no discrete lesion is noted.
